# Patient Record
Sex: Female | Born: 1960
Health system: Southern US, Community
[De-identification: ages and names within clinical notes are randomized; demographics above are authoritative.]

## PROBLEM LIST (undated history)

## (undated) DIAGNOSIS — IMO0002 Reserved for concepts with insufficient information to code with codable children: Secondary | ICD-10-CM

## (undated) DIAGNOSIS — A159 Respiratory tuberculosis unspecified: Secondary | ICD-10-CM

## (undated) DIAGNOSIS — I10 Essential (primary) hypertension: Secondary | ICD-10-CM

## (undated) DIAGNOSIS — E119 Type 2 diabetes mellitus without complications: Secondary | ICD-10-CM

## (undated) DIAGNOSIS — M329 Systemic lupus erythematosus, unspecified: Secondary | ICD-10-CM

## (undated) DIAGNOSIS — E785 Hyperlipidemia, unspecified: Secondary | ICD-10-CM

## (undated) HISTORY — PX: NO PAST SURGERIES: SHX2092

---

## 2015-04-10 ENCOUNTER — Institutional Professional Consult (permissible substitution): Payer: Self-pay | Admitting: Internal Medicine

## 2016-01-05 ENCOUNTER — Encounter (HOSPITAL_COMMUNITY): Payer: Self-pay | Admitting: Emergency Medicine

## 2016-01-05 ENCOUNTER — Emergency Department (HOSPITAL_COMMUNITY): Payer: Medicaid - Out of State

## 2016-01-05 ENCOUNTER — Emergency Department (HOSPITAL_COMMUNITY)
Admission: EM | Admit: 2016-01-05 | Discharge: 2016-01-05 | Disposition: A | Discharge status: Home / Self Care | Payer: Medicaid - Out of State | Attending: Emergency Medicine | Admitting: Emergency Medicine

## 2016-01-05 ENCOUNTER — Telehealth: Payer: Self-pay

## 2016-01-05 DIAGNOSIS — E785 Hyperlipidemia, unspecified: Secondary | ICD-10-CM | POA: Insufficient documentation

## 2016-01-05 DIAGNOSIS — R52 Pain, unspecified: Secondary | ICD-10-CM

## 2016-01-05 DIAGNOSIS — Z7984 Long term (current) use of oral hypoglycemic drugs: Secondary | ICD-10-CM | POA: Insufficient documentation

## 2016-01-05 DIAGNOSIS — R1013 Epigastric pain: Secondary | ICD-10-CM | POA: Insufficient documentation

## 2016-01-05 DIAGNOSIS — E119 Type 2 diabetes mellitus without complications: Secondary | ICD-10-CM | POA: Insufficient documentation

## 2016-01-05 HISTORY — DX: Systemic lupus erythematosus, unspecified: M32.9

## 2016-01-05 HISTORY — DX: Reserved for concepts with insufficient information to code with codable children: IMO0002

## 2016-01-05 HISTORY — DX: Hyperlipidemia, unspecified: E78.5

## 2016-01-05 HISTORY — DX: Type 2 diabetes mellitus without complications: E11.9

## 2016-01-05 LAB — CBC
HEMATOCRIT: 35.2 % — AB (ref 36.0–46.0)
HEMOGLOBIN: 11.5 g/dL — AB (ref 12.0–15.0)
MCH: 28.1 pg (ref 26.0–34.0)
MCHC: 32.7 g/dL (ref 30.0–36.0)
MCV: 86.1 fL (ref 78.0–100.0)
Platelets: 223 10*3/uL (ref 150–400)
RBC: 4.09 MIL/uL (ref 3.87–5.11)
RDW: 13 % (ref 11.5–15.5)
WBC: 6.1 10*3/uL (ref 4.0–10.5)

## 2016-01-05 LAB — URINALYSIS, ROUTINE W REFLEX MICROSCOPIC
BILIRUBIN URINE: NEGATIVE
Glucose, UA: NEGATIVE mg/dL
Hgb urine dipstick: NEGATIVE
KETONES UR: NEGATIVE mg/dL
Leukocytes, UA: NEGATIVE
Nitrite: NEGATIVE
PROTEIN: NEGATIVE mg/dL
SPECIFIC GRAVITY, URINE: 1.003 — AB (ref 1.005–1.030)
pH: 7 (ref 5.0–8.0)

## 2016-01-05 LAB — COMPREHENSIVE METABOLIC PANEL
ALBUMIN: 3.8 g/dL (ref 3.5–5.0)
ALK PHOS: 89 U/L (ref 38–126)
ALT: 18 U/L (ref 14–54)
ANION GAP: 9 (ref 5–15)
AST: 23 U/L (ref 15–41)
BUN: 11 mg/dL (ref 6–20)
CO2: 25 mmol/L (ref 22–32)
Calcium: 9.2 mg/dL (ref 8.9–10.3)
Chloride: 102 mmol/L (ref 101–111)
Creatinine, Ser: 0.64 mg/dL (ref 0.44–1.00)
GFR calc Af Amer: >60 mL/min (ref >60)
GFR calc non Af Amer: >60 mL/min (ref >60)
GLUCOSE: 91 mg/dL (ref 65–99)
POTASSIUM: 4.2 mmol/L (ref 3.5–5.1)
SODIUM: 136 mmol/L (ref 135–145)
Total Bilirubin: 0.6 mg/dL (ref 0.3–1.2)
Total Protein: 7.3 g/dL (ref 6.5–8.1)

## 2016-01-05 LAB — LACTIC ACID, PLASMA: LACTIC ACID, VENOUS: 1.2 mmol/L (ref 0.5–2.0)

## 2016-01-05 LAB — LIPASE, BLOOD: Lipase: 24 U/L (ref 11–51)

## 2016-01-05 MED ORDER — OMEPRAZOLE 20 MG PO CPDR: 20.0000 mg | DELAYED_RELEASE_CAPSULE | Freq: Every day | ORAL | Status: DC

## 2016-01-05 MED ORDER — MORPHINE SULFATE (PF) 2 MG/ML IV SOLN
2.0000 mg | Freq: Once | INTRAVENOUS | Status: AC
  Administered 2016-01-05: 2 mg via INTRAVENOUS
  Filled 2016-01-05: qty 1

## 2016-01-05 MED ORDER — PANTOPRAZOLE SODIUM 40 MG IV SOLR
40.0000 mg | Freq: Once | INTRAVENOUS | Status: AC
  Administered 2016-01-05: 40 mg via INTRAVENOUS
  Filled 2016-01-05: qty 40

## 2016-01-05 MED ORDER — HYDROCODONE-ACETAMINOPHEN 5-325 MG PO TABS: 2.0000 | ORAL_TABLET | ORAL | Status: DC | PRN

## 2016-01-05 MED ORDER — MORPHINE SULFATE (PF) 2 MG/ML IV SOLN
1.0000 mg | Freq: Once | INTRAVENOUS | Status: AC
  Administered 2016-01-05: 1 mg via INTRAVENOUS
  Filled 2016-01-05: qty 1

## 2016-01-05 MED ORDER — ONDANSETRON HCL 4 MG PO TABS: 4.0000 mg | ORAL_TABLET | Freq: Four times a day (QID) | ORAL | Status: DC

## 2016-01-05 MED ORDER — SUCRALFATE 1 G PO TABS: 1.0000 g | ORAL_TABLET | Freq: Three times a day (TID) | ORAL | Status: DC

## 2016-01-05 MED ORDER — GI COCKTAIL ~~LOC~~
30.0000 mL | Freq: Once | ORAL | Status: AC
  Administered 2016-01-05: 30 mL via ORAL
  Filled 2016-01-05: qty 30

## 2016-01-05 NOTE — Progress Notes (Signed)
CM spoke with pt who [place]cp.  CM discussed and provided written information to assist pt with determining choice for uninsured accepting pcps, discussed the importance of pcp vs EDP services for f/u care, www.needymeds.org, www.goodrx.com, discounted pharmacies and other [place] resources such as [facility] , [facility], affordable care act, financial assistance, uninsured dental services, [place] med assist, DSS and  health department  Reviewed resources for [place] uninsured accepting pcps like [facility], family medicine at [address], [facility], [facility], local urgent care centers, [facility], [facility], general medical clinics, family services of the [place], [facility] urgent care plus others, medication resources, [facility]ies and housing Pt voiced understanding and appreciation of resources provided   Provided [facility] contact information Pt agreed to a referral Cm completed referral explaining that the pt and sister only speaks vietnamese but Financial controllertracy driver, friend speaks english Pt to be contact by [facility] clinical liason Cm provided pt with a translated list of uninsured resources for transportation, DSS, medication resources and doctors  French Anaracy has a list of english resources for [employer]  Medicaid drs, [place] [employer]insured/medication resources, transportation resources Provided tracy with a 21 page medicaid application

## 2016-01-05 NOTE — ED Notes (Signed)
PA at bedside.

## 2016-01-05 NOTE — Progress Notes (Signed)
Pt with MEDICAID [place] but now with a [place] address Had english speaking visitor but pt indicating visitor "go" referring to gone from room  Cm used female interpreter vietnamese via stratus to speak with pt  Also ED PA, Shanda BumpsJessica F present to evaluate pt pain status Pt informed interpreter she has no family no relatives Pt states she has no transportation Pt states she was brought to ED by a driver but driver stepped out French Anaracy, driver returned near the end of CM assessment and interventions French Anaracy confirms pt is single, lives alone and has only a sister who is singe with children [place] in [place] but had moved to [place] and returned [place] in February 2017 States pt had been informed she could not work, [place] and did not have to pay for medications Pt is not eligible per French Anaracy for disability Cm explained to pt with interpreter and to Join[place]acy that each state in the [place] has a different medicaid program and in [place] pt medication may be $3 or less if she qualifies for [facility] Tracy offers to assist pt with medicaid application French Anaracy states she is not family or friend of pt, she just know her and pt came to her to ask to get her to hospital today.  Confirms pt without cell phone, Internet or home phone CM encouraged French Anaracy to assist pt to become involved in a [place] church environment that can get the pt connected with support system. Discussed that medicaid transportation is available to medical appts when pt gets medicaid Explained that without Medicaid in [place] the pt is considered uninsured at this time  Offered tracy a list of [place] [employer] doctors, DSS contact number and address, a list of [place] uninsured resources

## 2016-01-05 NOTE — ED Notes (Signed)
Per pt, states left lower abdominal pain for 4 days-no vomiting just nausea

## 2016-01-05 NOTE — ED Notes (Signed)
Patient tolerating crackers and peanut butter. Social work at bedside to provide resources for follow up, transportation assistance, and medication assistance.

## 2016-01-05 NOTE — ED Provider Notes (Signed)
CSN: [id number]     Arrival date & time 01/05/16  1102 History   First MD Initiated Contact with Patient 01/05/16 1124     Chief Complaint  Patient presents with  . Abdominal Pain     (Consider location/radiation/quality/duration/timing/severity/associated sxs/prior Treatment) Patient is a 55 y.o. female presenting with abdominal pain. The history is provided by the patient and a friend. A language interpreter was used.  Abdominal Pain Associated symptoms: cough, fever and nausea   Associated symptoms: no chest pain, no chills, no constipation, no diarrhea, no dysuria, no hematuria, no shortness of breath, no sore throat and no vomiting    Patient is a 55 year old female with a history of diabetes, GERD, Lupus who presents to the ED with 4 days of worsening abdominal pain. She states the pain begins in her abdomen and radiates superiorly to the epigastric region. Patient states pain is worse with eating, worse with lying down, worse with deep inspiration, worse with burping. Patient states she's not eaten in 4 days. She said she can tolerate water. Patient also has right and left-sided rib pain that she describes as deep and worse if she lies on her sides. Patient states associated arthralgias, myalgias, nausea, subjective fever, joint swelling, increased redness, warmth, and swelling in her hands that she states is from her Lupus. Patient denies chest pain, short of breath, visual changes, dysuria, hematuria, hematochezia, changes in bowel or bladder functions. Patient was on chronic prednisone but ran out roughly a month and half ago and has not taken it since. Patient also endorses a mild intermittent dry cough. [place] and [place] trying to establish care. She was going to see Dr. Shelva MajesticWest, pulmonology today when they instructed her to come to the ED to have her abdominal pain evaluated.   Past Medical History  Diagnosis Date  . Diabetes mellitus without complication (HCC)    . GERD (gastroesophageal reflux disease)   . Hyperlipidemia   . Lupus (HCC)    No past surgical history on file. No family history on file. Social History  Substance Use Topics  . Smoking status: Never Smoker   . Smokeless tobacco: None  . Alcohol Use: No   OB History    No data available     Review of Systems  Constitutional: Positive for fever. Negative for chills and diaphoresis.  HENT: Negative for ear pain, facial swelling, sore throat and trouble swallowing.   Eyes: Negative for visual disturbance.  Respiratory: Positive for cough. Negative for chest tightness and shortness of breath.   Cardiovascular: Negative for chest pain and leg swelling.  Gastrointestinal: Positive for nausea and abdominal pain. Negative for vomiting, diarrhea, constipation, blood in stool and abdominal distention.  Genitourinary: Negative for dysuria and hematuria.  Musculoskeletal: Positive for myalgias, joint swelling and arthralgias. Negative for back pain and neck pain.  Skin: Positive for color change. Negative for rash.  Neurological: Negative for dizziness, syncope, weakness and numbness.      Allergies  Review of patient's allergies indicates no known allergies.  Home Medications   Prior to Admission medications   Medication Sig Start Date End Date Taking? Authorizing Provider  ASA-APAP-Salicyl-Caff (PAIN RELIEF) TABS Take 1 tablet by mouth daily as needed (pain).   Yes Historical Provider, MD  atorvastatin (LIPITOR) 20 MG tablet Take 20 mg by mouth daily.   Yes Historical Provider, MD  metFORMIN (GLUCOPHAGE) 500 MG tablet Take 500 mg by mouth daily.   Yes Historical Provider, MD  predniSONE (DELTASONE) 10 MG tablet Take 10 mg by mouth 3 (three) times daily.   Yes Historical Provider, MD  ranitidine (ZANTAC) 150 MG tablet Take 150 mg by mouth 2 (two) times daily.   Yes Historical Provider, MD  HYDROcodone-acetaminophen (NORCO/VICODIN) 5-325 MG tablet Take 2 tablets by mouth every 4  (four) hours as needed. 01/05/16   Jerre Simon, PA  omeprazole (PRILOSEC) 20 MG capsule Take 1 capsule (20 mg total) by mouth daily. 01/05/16   Maylene Crocker L Treniyah Lynn, PA  ondansetron (ZOFRAN) 4 MG tablet Take 1 tablet (4 mg total) by mouth every 6 (six) hours. 01/05/16   Jerre Simon, PA  sucralfate (CARAFATE) 1 g tablet Take 1 tablet (1 g total) by mouth 4 (four) times daily -  with meals and at bedtime. 01/05/16   Emry Barbato L Ranald Alessio, PA   BP 166/98 mmHg  Pulse 100  Temp(Src) 98 F (36.7 C) (Oral)  Resp 16  Wt 43.092 kg  SpO2 99%  LMP  Physical Exam  Constitutional: She appears well-developed and well-nourished. No distress.  HENT:  Head: Normocephalic and atraumatic.  Eyes: Conjunctivae are normal. No scleral icterus.  Neck: Normal range of motion. Neck supple.  Cardiovascular: Normal rate, regular rhythm and normal heart sounds.  Exam reveals no gallop and no friction rub.   No murmur heard. Pulses:      Radial pulses are 2+ on the right side, and 2+ on the left side.       Dorsalis pedis pulses are 2+ on the right side, and 2+ on the left side.  Pulmonary/Chest: Effort normal and breath sounds normal. No respiratory distress. She has no wheezes. She has no rales. She exhibits no tenderness.  Rib pain not reproducible on exam  Abdominal: Normal appearance and bowel sounds are normal. She exhibits no distension. There is no rigidity, no rebound, no guarding, no CVA tenderness and no tenderness at McBurney's point.  Generalized abdominal tenderness, exquisitely tender to palpation of the epigastric region,  Musculoskeletal:  No edema, no deformities noted. Examination of bilateral hands revealed erythematous, edematous MCP PIP and DIP joints, skin is erythematous.  Neurological: She is alert. Coordination normal.  Skin: Skin is warm and dry. She is not diaphoretic.  Psychiatric: She has a normal mood and affect. Her behavior is normal.    ED Course  Procedures (including critical care  time) Labs Review Labs Reviewed  CBC - Abnormal; Notable for the following:    Hemoglobin 11.5 (*)    HCT 35.2 (*)    All other components within normal limits  URINALYSIS, ROUTINE W REFLEX MICROSCOPIC (NOT AT [facility]) - Abnormal; Notable for the following:    Specific Gravity, Urine 1.003 (*)    All other components within normal limits  LIPASE, BLOOD  COMPREHENSIVE METABOLIC PANEL  LACTIC ACID, PLASMA  LACTIC ACID, PLASMA    Imaging Review Dg Abd Acute W/chest  01/05/2016  CLINICAL DATA:  Severe pain in the diaphragm radiating into the chest. Shortness of breath. EXAM: DG ABDOMEN ACUTE W/ 1V CHEST COMPARISON:  None. FINDINGS: Old fractures involving the left fourth and fifth ribs. No significant airspace disease or pulmonary edema in the lungs. Heart and mediastinum are within normal limits. Atherosclerotic calcifications at the aortic arch. Nonobstructive bowel gas pattern. Gas and stool in the colon. No evidence for free air. No large abdominal calcifications. Bone structures appear to be intact. IMPRESSION: No acute abnormalities. Electronically Signed   By: Meriel Pica.D.  On: 01/05/2016 13:04   I have personally reviewed and evaluated these images and lab results as part of my medical decision-making.   EKG Interpretation   Date/Time:  Tuesday Jan 05 2016 13:38:52 EDT Ventricular Rate:  87 PR Interval:  140 QRS Duration: 88 QT Interval:  357 QTC Calculation: 429 R Axis:   90 Text Interpretation:  Sinus rhythm Borderline right axis deviation  Borderline T abnormalities, anterior leads Baseline wander in lead(s) V1  Confirmed by Fayrene Fearing  MD, MARK ([id number]) on 01/05/2016 2:06:36 PM      MDM   Final diagnoses:  Pain  Epigastric pain    Patient is nontoxic, nonseptic appearing, in no apparent distress.  Patient's pain and other symptoms adequately managed in emergency department.  Fluid bolus given.  Labs, imaging, EKG and vitals reviewed and are all reassuring.  Patient does  not meet the SIRS or Sepsis criteria.  On repeat exam patient does not have a surgical abdomin and there are no peritoneal signs.  No indication of appendicitis, bowel obstruction, bowel perforation, cholecystitis, diverticulitis. Pt was eating and drinking in the ED prior to discharge without complaints and without nausea or vomiting. This is likely a gastritis. Pts pain improved with a GI cocktail and pain meds.  Patient discharged home with symptomatic treatment and given strict instructions for follow-up with a primary care physician and a GI specialist regarding her abdominal pain and her lupus.  I have also discussed reasons to return immediately to the ER.  Patient expresses understanding and agrees with plan.  Case discussed with Dr. Fayrene Fearing who agrees with the above plan.        Jerre Simon, PA 01/06/16 [id number]  Rolland Porter, MD 01/14/16 [id number]

## 2016-01-05 NOTE — Discharge Instructions (Signed)
Follow up with Dr. Shelva MajesticWest in 2 days regarding your Lupus and abdominal pain.   Obtain a primary care provider.   Return to the emergency department if you experience worsening abdominal pain, vomiting, diarrhea, fever, chills, blood in your vomit or stools.  ?au b?ng, Ng??i l?n (Abdominal Pain, Adult) C nhi?u nguyn nhn d?n ??n ?au b?ng. Thng th??ng ?au b?ng l do m?t b?nh gy ra v s? khng ?? n?u khng ?i?u tr?Marland Kitchen. B?nh ny c th? ???c theo di v ?i?u tr? t?i nh. Chuyn gia ch?m [place] s?c kh?e s? ti?n hnh khm th?c th? v c th? yu c?u lm xt nghi?m mu v ch?p X quang ?? xc ??nh m?c ?? nghim tr?ng c?a c?n ?au b?ng. Tuy nhin, trong nhi?u tr??ng h?p, ph?i m?t nhi?u th?i gian h?n ?? xc ??nh r nguyn nhn gy ?au b?ng. Tr??c khi tm ra nguyn nhn, chuyn gia ch?m [place] s?c kh?e c th? khng bi?t li?u qu v? c c?n lm thm xt nghi?m ho?c ti?p t?c ?i?u tr? hay khng. H??NG D?N CH?M [place] T?I NH  Theo di c?n ?au b?ng xem c b?t k? thay ??i no khng. Nh?ng hnh ??ng sau c th? gip lo?i b? b?t c? c?m gic kh ch?u no qu v? ?ang b?.  Ch? s? d?ng thu?c khng c?n k ??n ho?c thu?c c?n k ??n theo ch? d?n c?a chuyn gia ch?m [place] s?c kh?e.  Khng dng thu?c nhu?n trng tr? khi ???c chuyn gia ch?m [place] s?c kh?e ch? ??nh.  Th? dng m?t ch? ?? ?n l?ng (n??c lu?c th?t, tr, ho?c n??c) theo ch? d?n c?a chuyn gia ch?m [place] s?c kh?e. Chuy?n d?n sang m?t ch? ?? ?n nh? n?u ch?u ???c. ?I KHM N?U:  Qu v? b? ?au b?ng khng r nguyn nhn.  Qu v? b? ?au b?ng km theo bu?n nn ho?c tiu ch?y.  Qu v? b? ?au khi ?i ti?u ho?c ?i ngoi.  Qu v? b? c?n ?au b?ng lm th?c gi?c vo ban ?m.  Qu v? b? ?au b?ng n?ng thm ho?c ?? h?n khi ?n.  Qu v? b? ?au b?ng n?ng thm khi ?n ?? ?n nhi?u ch?t bo.  Qu v? b? s?t. NGAY L?P T?C ?I KHM N?U:   C?n ?au khng kh?i trong vng 2 gi?Ladell Heads.  Qu v? v?n ti?p t?c b? i (nn m?a).  Ch? c th? c?m th?y ?au ? m?t s? ph?n b?ng, ch?ng h?n nh? ? ph?n b?ng bn ph?i  ho?c bn d??i tri.  Phn c?a qu v? c mu ho?c c mu ?en nh? h?c n. ??M B?O QU V?:  Hi?u r cc h??ng d?n ny.  S? theo di tnh tr?ng c?a mnh.  S? yu c?u tr? gip ngay l?p t?c n?u qu v? c?m th?y khng kh?e ho?c th?y tr?m tr?ng h?n.   Thng tin ny khng nh?m m?c ?ch thay th? cho l?i khuyn m chuyn gia ch?m [place] s?c kh?e ni v?i qu v?. Hy b?o ??m qu v? ph?i th?o lu?n b?t k? v?n ?? g m qu v? c v?i chuyn gia ch?m [place] s?c kh?e c?a qu v?.   Document Released: 07/25/2005 Document Revised: 08/15/2014 Elsevier Interactive Patient Education [employer]2016 Elsevier Inc.

## 2016-01-05 NOTE — Telephone Encounter (Signed)
Pt is in the lobby (with Translator) requesting a new patient consult. [place], recently moved down to [place], has all records in her possession.  Having severe pain in diaphragm radiating into chest. C/o SOB, nausea x 4 days and coughing up white-yellow mucus x 1 week. States that pain is worse with breathing and eating. Pt has been unable to eat for 24hrs d/t pain being 10/10, pt able to drink water with pain level at about 5/10. Pt unable to sleep d/t not being able to lay flat. Does not have a PCP and was advised to go to ED by Translator last night - pt refused- did not understand recommendations.  Spoke with Dr Kendrick FriesMcQuaid, advised pt to go to ED to be checked out as her symptoms sound more than just Pulmonary.  Advised patient, through translator, that [facility]. Expressed understanding. Nothing further needed.

## 2016-01-08 ENCOUNTER — Telehealth: Payer: Self-pay | Admitting: Internal Medicine

## 2016-01-08 NOTE — Telephone Encounter (Signed)
Pt scheduled to see Amy Esterwood PA 01/13/16@10 :30am for abdominal pain and nausea. Please notify pt of appt.

## 2016-01-08 NOTE — Telephone Encounter (Signed)
Doc of Day  Caller name: Lucille Passyrang Call back number: [phone]   Reason for call:  Pt was seen in ED and advised to follow up in 2 days per AVS.  [place]

## 2016-01-12 ENCOUNTER — Ambulatory Visit (INDEPENDENT_AMBULATORY_CARE_PROVIDER_SITE_OTHER): Payer: Medicaid - Out of State | Admitting: Pulmonary Disease

## 2016-01-12 ENCOUNTER — Encounter: Payer: Self-pay | Admitting: Pulmonary Disease

## 2016-01-12 VITALS — BP 128/78 | HR 119 | Ht 59.0 in | Wt 90.6 lb

## 2016-01-12 DIAGNOSIS — M329 Systemic lupus erythematosus, unspecified: Secondary | ICD-10-CM

## 2016-01-12 DIAGNOSIS — J849 Interstitial pulmonary disease, unspecified: Secondary | ICD-10-CM

## 2016-01-12 DIAGNOSIS — A150 Tuberculosis of lung: Secondary | ICD-10-CM

## 2016-01-12 DIAGNOSIS — R1011 Right upper quadrant pain: Secondary | ICD-10-CM

## 2016-01-12 NOTE — Patient Instructions (Addendum)
1.  We will order a chest ct scan to check your lungs.  2. We will hold off on your lupus meds pending chest ct scan.  3. Call the office if you start having cough, fever, shortness of breath.   Return to clinic in 3 months.    1. Chng ti s? yu c?u ch?p CTTG ?? ki?m tra ph?i c?a b?n. 2. Chng ti s? gi? trn lupus med c?a b?n ?ang ch? qut ng?c. 3. G?i ?i?n tho?i cho v?n phng n?u b?n b?t ??u b? ho, s?t, th? d?c.  Quay tr? l?i phng khm trong vng 3 thng.

## 2016-01-12 NOTE — Progress Notes (Signed)
Subjective:    Patient ID: Cindy Wheeler, female    DOB: May 18, 1961, 55 y.o.   MRN: [id number]  HPI   This is the case of Cindy Wheeler, 55 y.o. Female, who was referred by Dr. Tish Men  in consultation regarding dyspnea and her lung issues.   As you very well know, patient is [place] and I was able to communicate with her with an interpreter.  Pt was recently seen at ED for abd pain. Given her lung issues, she was sent to our office to be seen.   Pt has been in the [place] since 2003. She denies any lung issues until 2016. No history of asthma, copd, PTB until 2016. She has been in [place] since 2003. She visited [place] in 2015 and she stayed there for 3 mos. While she was visiting, she end up being sick -- had fever, chills, weight loss, cough, dyspnea, joint pains.  She came back to the [place] in 09/2014.  Her first stop was [place], [place].  I think she was very symptomatic so she ended up seeing doctors and was admitted at [facility].  Pt was seen by Pulm and ID.  She had acute hypoxemic respiratory failure and diffuse pulmonary infiltrates.  She end up having bronchoscopy.    Pt was treated as lupus pneumonitis while in [place].  She ended up being on high steroid and prednisone doses as well as CellCept and Bactrim for PCP prophylaxis.  She was being seen by Dr. Melanie Crazier.  The last office note by Dr. Michel Bickers (March 05, 2015) stated that pt had clinically and radiographically  improved with prednisone (she was on 30 mg/d) as well as CellCept  (500 mg in am and 1000 mg in pm). The plan was to wean off prednisone. She would need to see pulmonary as she had planned to move back to [place].   Pt also ended up being (+) for PTB with the BAL from bronchoscopy. She ended up being on 4 medications for PTB and she was actively managed by [facility] in [place].  Per pt, she was given 4 meds (Ethambutol, Isoniazid, Rifampin) for 6 months.  She had stopped the TB meds even before  leaving for [facility].  Pt ended up coming back to [facility] in 09/2015.  She was NOT able to see any MD until her ER visit end of May 2017.  She ran out of prednisone in 11/2015.  She also was not able to get any CellCept since 09/2015. She has remained asymptomatic until end of May 2017.  She started having episodic substernal discomfort.  That resolved after < 1 week.  She then had RUQ episodic pain which she describes bothers her with inspiration and she denied any association with food.  She presented to ED with this and no cause was found for her RUQ pain. Other than her pain and her persistent poor appetite (but denies significant weight loss), she remains relatively OK. She denies being "sick" again just like how she was in 09/2014 in [place].    Review of Systems  Constitutional: Negative.  Negative for fever, chills and unexpected weight change.  HENT: Negative.  Negative for congestion, dental problem, ear pain, nosebleeds, postnasal drip, rhinorrhea, sinus pressure, sneezing, sore throat and trouble swallowing.   Eyes: Negative.  Negative for redness and itching.  Respiratory: Positive for cough and shortness of breath. Negative for chest tightness and wheezing.   Cardiovascular: Negative for palpitations and leg swelling.  Gastrointestinal: Positive for abdominal pain. Negative for nausea and vomiting.       RUQ abd pain x 1-2 weeks, worse with inspiration.   Genitourinary: Negative for dysuria.  Musculoskeletal: Positive for joint swelling and arthralgias.  Skin: Positive for rash.  Allergic/Immunologic: Negative.   Neurological: Positive for dizziness and light-headedness. Negative for headaches.  Hematological: Negative.  Does not bruise/bleed easily.  Psychiatric/Behavioral: Negative.  Negative for dysphoric mood. The patient is not nervous/anxious.    Past Medical History  Diagnosis Date  . Diabetes mellitus without complication (HCC)   . GERD (gastroesophageal reflux disease)    . Hyperlipidemia   . Lupus (HCC)    (-) CA, DVT Denies any PTB exposure or dse until 09/2014  No family history on file.  Mother is deceased. Had DM. Father is healthy.  (-) PTB in family.   No past surgical history on file.  (-) surgery.   Social History   Social History  . Marital Status: Single    Spouse Name: N/A  . Number of Children: N/A  . Years of Education: N/A   Occupational History  . Not on file.   Social History Main Topics  . Smoking status: Never Smoker   . Smokeless tobacco: Not on file  . Alcohol Use: No  . Drug Use: Not on file  . Sexual Activity: Not on file   Other Topics Concern  . Not on file   Social History Narrative  From [place], been in [place] since 2003. 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 (-) smoke, ETOH.  Single, no children. Lives alone. Has sisters/family in [place].   No Known Allergies   Outpatient Prescriptions Prior to Visit  Medication Sig Dispense Refill  . ASA-APAP-Salicyl-Caff (PAIN RELIEF) TABS Take 1 tablet by mouth daily as needed (pain).    Marland Kitchen atorvastatin (LIPITOR) 20 MG tablet Take 20 mg by mouth daily.    Marland Kitchen HYDROcodone-acetaminophen (NORCO/VICODIN) 5-325 MG tablet Take 2 tablets by mouth every 4 (four) hours as needed. 16 tablet 0  . metFORMIN (GLUCOPHAGE) 500 MG tablet Take 500 mg by mouth daily.    Marland Kitchen omeprazole (PRILOSEC) 20 MG capsule Take 1 capsule (20 mg total) by mouth daily. 30 capsule 0  . ondansetron (ZOFRAN) 4 MG tablet Take 1 tablet (4 mg total) by mouth every 6 (six) hours. 20 tablet 0  . predniSONE (DELTASONE) 10 MG tablet Take 10 mg by mouth 3 (three) times daily.    . ranitidine (ZANTAC) 150 MG tablet Take 150 mg by mouth 2 (two) times daily.    . sucralfate (CARAFATE) 1 g tablet Take 1 tablet (1 g total) by mouth 4 (four) times daily -  with meals and at bedtime. 20 tablet 0   No facility-administered medications prior to visit.   No orders of the defined types were placed in this encounter.           Objective:   Physical Exam  Vitals:  Filed Vitals:   01/12/16 1512  BP: 128/78  Pulse: 119  Height: 4\' 11"  (1.499 m)  Weight: 90 lb 9.6 oz (41.096 kg)  SpO2: 99%    Constitutional/General:  Pleasant, well-nourished, well-developed, not in any distress,  Comfortably seating.  Well kempt  Body mass index is 18.29 kg/(m^2). Wt Readings from Last 3 Encounters:  01/12/16 90 lb 9.6 oz (41.096 kg)  01/05/16 95 lb (43.092 kg)      HEENT: Pupils equal and reactive to light and accommodation. Anicteric sclerae. Normal  nasal mucosa.   No oral  lesions,  mouth clear,  oropharynx clear, no postnasal drip. (-) Oral thrush. No dental caries.  Airway - Mallampati class III  Neck: No masses. Midline trachea. No JVD, (-) LAD. (-) bruits appreciated.  Respiratory/Chest: Grossly normal chest. (-) deformity. (-) Accessory muscle use.  Symmetric expansion. (-) Tenderness on palpation.  Resonant on percussion.  Diminished BS on both lower lung zones. (-) wheezing, crackles, rhonchi (-) egophony  Cardiovascular: Regular rate and  rhythm, heart sounds normal, no murmur or gallops, no peripheral edema  Gastrointestinal:  Normal bowel sounds. Soft. No hepatosplenomegaly. Some RUQ tenderness. (-) rebound.  (-) masses.   Musculoskeletal:  Normal muscle tone. Normal gait.   Extremities: Grossly normal. (-) clubbing, cyanosis.  (-) edema  Skin: (-) rash,lesions seen.   Neurological/Psychiatric : alert, oriented to time, place, person. Normal mood and affect            Assessment & Plan:  ILD (interstitial lung disease) (HCC) Pt had diffuse pulmonary infiltrates and hypoxemia in 09/2014. She was symptomatic with fevers, chills, cough, weight loss and SOB. Treated as lupus pneumonitis with high dose prednisone and Cell Cept (500 mg in am and 100 mg in pm).  She was in [place] during this time and moved back to [place] in 09/2015. She ran out of Cell Cept in 09/2015 and  Prednisone (30 mg/d) in 11/2015. She remains asymptomatic except for a 2 week h/o RUQ pain in 12/2015.  CXR in 12/2015 > No definite infiltrate seen.  CXR in 02/2015 > mild bibasilar infiltrates (per records)  I am not sure if the "RUQ pain" is a manifestation of her lupus pneumonitis flaring up as pt weaned off meds. No other sx.   Plan to get a HRCT scan.  If she has infiltrates, likely will need a bronch and BAL as we need to R/O PTB again and other OI.  Will hold off on meds pending HRCT.   TB (pulmonary tuberculosis) Pt had diffuse lung infiltrates in 09/2014. Had bronch and BAL which showed PTB.  Was being manged by [facility] [place]. Pt was on 4 meds (Isoniazid, Rifampin, Ethambutol) for 6 mos. Finished sometime end of 2016.  Not sure if pt was treated as latent TB vs active TB.  I think she was treated as a latent TB.  Need to rpt chest ct scan. If (+) for significant infiltrates, will need bronch and BAL to R/O PTB. Need to get records from TB clinic.   Lupus (HCC) Pt treated as severe lupus pneumonitis in 09/2014. Was on prednisone and cellcept. These meds were weaned off by the pt by 11/2015.   RUQ pain Pt with episodic RUQ pain x 1-2 weeks. Associated with inspiration. Denies any association with food. Pt is to see her PCP on 6/7.  I will defer w/u to PCP.  Likely will need RUQ US > not sure if she has gallstones.  Likely will need GI evaluation.      I personally reviewed previous images (Chest Xray, Chest Ct scan) done on this patient. I reviewed the reports on the images as well. I also extensively reviewed patient records [place], [place].    Thank you very much for letting me participate in this patient's care. Please do not hesitate to give me a call if you have any questions or concerns regarding the treatment plan.   Patient will follow up with me in 2-3 months, or sooner if HRCT  is (+).  I advised pt to call the office if she gets more symptomatic.      Pollie Meyer, MD 01/13/2016   3:44 AM Pulmonary and Critical Care Medicine [facility] Pager: [phone] Office: [phone], Fax: [phone]

## 2016-01-13 ENCOUNTER — Encounter: Payer: Self-pay | Admitting: Physician Assistant

## 2016-01-13 ENCOUNTER — Telehealth: Payer: Self-pay | Admitting: Pulmonary Disease

## 2016-01-13 ENCOUNTER — Ambulatory Visit (INDEPENDENT_AMBULATORY_CARE_PROVIDER_SITE_OTHER): Payer: Self-pay | Admitting: Physician Assistant

## 2016-01-13 VITALS — BP 136/70 | HR 104 | Ht <= 58 in | Wt 90.1 lb

## 2016-01-13 DIAGNOSIS — M329 Systemic lupus erythematosus, unspecified: Secondary | ICD-10-CM | POA: Insufficient documentation

## 2016-01-13 DIAGNOSIS — IMO0002 Reserved for concepts with insufficient information to code with codable children: Secondary | ICD-10-CM | POA: Insufficient documentation

## 2016-01-13 DIAGNOSIS — R1011 Right upper quadrant pain: Secondary | ICD-10-CM | POA: Insufficient documentation

## 2016-01-13 DIAGNOSIS — J849 Interstitial pulmonary disease, unspecified: Secondary | ICD-10-CM | POA: Insufficient documentation

## 2016-01-13 DIAGNOSIS — A150 Tuberculosis of lung: Secondary | ICD-10-CM | POA: Insufficient documentation

## 2016-01-13 NOTE — Assessment & Plan Note (Signed)
Pt with episodic RUQ pain x 1-2 weeks. Associated with inspiration. Denies any association with food. Pt is to see her PCP on 6/7.  I will defer w/u to PCP.  Likely will need RUQ US > not sure if she has gallstones.  Likely will need GI evaluation.

## 2016-01-13 NOTE — Patient Instructions (Signed)
Proceed with the CT of the chest at [facility] on 01-20-2016 at 9:30 am.  The doctor will get back to you with the CT results.  There is no appointment available on that date for an Ultrasound.

## 2016-01-13 NOTE — Progress Notes (Addendum)
Patient ID: [place], female   DOB: 07/08/1961, 55 y.o.   MRN: [id number]   Subjective:    Patient ID: [place], female    DOB: [birth year], 55 y.o.   MRN: [id number]  HPI C[place] is a 55 year old [place] female, new to GI today referred by [facility]  pulmonology /Dr DeDios for evaluation of right upper quadrant abdominal pain. Patient is non-English speaking and just located to [place] from [place] Some of her history is gleaned from the pulmonary consultation done yesterday. She apparently had been in [place] since 2003 but went back to [place] in 2015 to visit and then became ill while she was there with fever or chills weight loss cough and joint pains. She returned to [place] in February 2016 and went to [place]. She became sick while there was admitted to the hospital with respiratory failure and pulmonary infiltrates. She was treated as lupus pneumonitis and was treated with steroids and CellCept as well as Bactrim for PCP prophylaxis. She had bronchoscopy which was positive for PE TB and was treated for 6 months. Am not exactly sure of the time line but says that she just returned to [facility] in February 2017. She has a sister who lives here she was not able to see an M.D. until she went to the emergency room in May. She had run out of her medications in April then started developing substernal discomfort right upper quadrant pain with inspiration and cough. Plan per pulmonary was to get a CT of the chest I, then decide  if she needed bronchoscopy .She was not given any new medications. Patient states she's not had any prior GI problems. She says her current right upper quadrant pain has been present for about 2 weeks and constant and is worse with inspiration or lying on her side. She has not noticed any change with by mouth intake appetite has been fair weight has been stable she denies any nausea or vomiting, no dysphagia, or odynophagia. No known injury muscle strain etc. Bowel  movements have been normal no melena or hematochezia. He denies any recent fever or chills. She does have a prescription for hydrocodone but wasn't sure if it was okay to use this for her right upper quadrant pain. Labs done on 01/05/2016 CBC and CMET unremarkable with the exception of hemoglobin 11.5 hematocrit 35.2 and MCV of 86.  Patient tells me today that she does not have any means of transportation to get to any testing and that her sister who drives only knows how to get from her place of employment back and forth to home. Patient has no insurance, no means of support and no money.  Review of Systems Pertinent positive and negative review of systems were noted in the above HPI section.  All other review of systems was otherwise negative.  Outpatient Encounter Prescriptions as of 01/13/2016  Medication Sig  . HYDROcodone-acetaminophen (NORCO/VICODIN) 5-325 MG tablet Take 2 tablets by mouth every 4 (four) hours as needed.  Marland Kitchen omeprazole (PRILOSEC) 20 MG capsule Take 1 capsule (20 mg total) by mouth daily.  . ranitidine (ZANTAC) 150 MG tablet Take 150 mg by mouth 2 (two) times daily.  . [DISCONTINUED] ASA-APAP-Salicyl-Caff (PAIN RELIEF) TABS Take 1 tablet by mouth daily as needed (pain).  . [DISCONTINUED] atorvastatin (LIPITOR) 20 MG tablet Take 20 mg by mouth daily.  . [DISCONTINUED] metFORMIN (GLUCOPHAGE) 500 MG tablet Take 500 mg by mouth daily.  . [DISCONTINUED] ondansetron (ZOFRAN) 4 MG tablet Take 1  tablet (4 mg total) by mouth every 6 (six) hours.  . [DISCONTINUED] predniSONE (DELTASONE) 10 MG tablet Take 10 mg by mouth 3 (three) times daily.  . [DISCONTINUED] sucralfate (CARAFATE) 1 g tablet Take 1 tablet (1 g total) by mouth 4 (four) times daily -  with meals and at bedtime.   No facility-administered encounter medications on file as of 01/13/2016.   No Known Allergies Patient Active Problem List   Diagnosis Date Noted  . ILD (interstitial lung disease) (HCC) 01/13/2016  . TB  (pulmonary tuberculosis) 01/13/2016  . Lupus (HCC) 01/13/2016  . RUQ pain 01/13/2016   Social History   Social History  . Marital Status: Single    Spouse Name: N/A  . Number of Children: 0  . Years of Education: N/A   Occupational History  . Not on file.   Social History Main Topics  . Smoking status: Never Smoker   . Smokeless tobacco: Never Used  . Alcohol Use: No  . Drug Use: No  . Sexual Activity: Not on file   Other Topics Concern  . Not on file   Social History Narrative    Ms. Scherger's family history includes Diabetes in her mother.      Objective:    Filed Vitals:   01/13/16 1021  BP: 136/70  Pulse: 104    Physical Exam  well-developed [place] female in no acute distress, she is accompanied by an interpreter and does not speak any [place]. Blood pressure 136/70, pulse 104 height 4 foot 9 weight 90 pounds BMI of 19 HEENT ;nontraumatic normocephalic EOMI PERRLA sclera anicteric, Cardiovascular regular rate and rhythm with S1-S2 slightly tachy, no murmur rub or gallop, Pulmonary ;clear bilaterally, Abdomen ;soft, there is no palpable mass or hepatosplenomegaly she is tender in the right upper quadrant to deep palpation, there is no specific tenderness along the costal margin, she does have a small macular erythematous lesion along her right flank which she says is pruritic, bowel sounds present, Rectal; exam not done, Extremities no clubbing cyanosis or edema skin warm and dry, Neuropsych ;mood and affect appropriate     Assessment & Plan:   #1 55 yo Non-English speaking [place] female recently located to [place] with complaints of right upper quadrant pain 2 weeks which by history sounds pleuritic as it is significantly increased with inspiration. Etiology of her pain is not clear. Patient has history of lupus pneumonitis by her records and was also treated for TB in 2016 while she was living in [place]. She had pulmonary evaluation yesterday and CT of  the chest is pending. It is felt she may require bronchoscopy as well. #2 mild normocytic anemia #3 complete lack of resources  Plan; we attempted to get an upper abdominal ultrasound scheduled on the same day that her CT of the chest is scheduled for next week on [facility]/22/2015 [facility]. They did not have any openings that day. Had an extensive conversation with the patient who is primarily concerned about her lack of ability to have transportation to any of her testing. She also says she doesn't know where to go to get help and has no insurance or money. I will contact social services at [facility] for advice regarding any sources that may be available to this patient and we will contact her with that information. I'm not sure that she'll go for the CT next week as she says she has no way to get there. I believe she needs  pulmonary workup first, I am not sure how much GI workup we are going to be able to accomplish given her situation. She was advised she could use the hydrocodone she has perhaps one half tablet every 4-6 hours as needed if her right upper quadrant pain is severe and also advised use of a heating pad   Patriece Archbold S Yousof Alderman PA-C 01/13/2016   Cc: No ref. provider found  Addendum: Reviewed and agree with initial management. Beverley Fiedler, MD

## 2016-01-13 NOTE — Assessment & Plan Note (Signed)
Pt treated as severe lupus pneumonitis in 09/2014. Was on prednisone and cellcept. These meds were weaned off by the pt by 11/2015.

## 2016-01-13 NOTE — Assessment & Plan Note (Signed)
Pt had diffuse lung infiltrates in 09/2014. Had bronch and BAL which showed PTB.  Was being manged by [facility] [place]. Pt was on 4 meds (Isoniazid, Rifampin, Ethambutol) for 6 mos. Finished sometime end of 2016.  Not sure if pt was treated as latent TB vs active TB.  I think she was treated as a latent TB.  Need to rpt chest ct scan. If (+) for significant infiltrates, will need bronch and BAL to R/O PTB. Need to get records from TB clinic.

## 2016-01-13 NOTE — Telephone Encounter (Signed)
[place] -- 1. Can you get records (last 2-3 OV notes) from [facility] in [place]? 2. Can we get the images of CXR and/ or Chest Ct scan on a disc from [facility] in [place]?   Thanks!  AD

## 2016-01-13 NOTE — Assessment & Plan Note (Addendum)
Pt had diffuse pulmonary infiltrates and hypoxemia in 09/2014. She was symptomatic with fevers, chills, cough, weight loss and SOB. Treated as lupus pneumonitis with high dose prednisone and Cell Cept (500 mg in am and 100 mg in pm).  She was in [place] during this time and moved back to [place] in 09/2015. She ran out of Cell Cept in 09/2015 and Prednisone (30 mg/d) in 11/2015. She remains asymptomatic except for a 2 week h/o RUQ pain in 12/2015.  CXR in 12/2015 > No definite infiltrate seen.  CXR in 02/2015 > mild bibasilar infiltrates (per records)  I am not sure if the "RUQ pain" is a manifestation of her lupus pneumonitis flaring up as pt weaned off meds. No other sx.   Plan to get a HRCT scan.  If she has infiltrates, likely will need a bronch and BAL as we need to R/O PTB again and other OI.  Will hold off on meds pending HRCT.

## 2016-01-20 ENCOUNTER — Ambulatory Visit (INDEPENDENT_AMBULATORY_CARE_PROVIDER_SITE_OTHER)
Admission: RE | Admit: 2016-01-20 | Discharge: 2016-01-20 | Disposition: A | Discharge status: Home / Self Care | Payer: Self-pay | Source: Ambulatory Visit | Attending: Pulmonary Disease | Admitting: Pulmonary Disease

## 2016-01-20 DIAGNOSIS — J849 Interstitial pulmonary disease, unspecified: Secondary | ICD-10-CM

## 2016-01-20 NOTE — Telephone Encounter (Signed)
Records requests faxed to both facilities. Confirmation received. Nothing further needed.

## 2016-01-21 ENCOUNTER — Ambulatory Visit: Payer: Self-pay | Admitting: Pulmonary Disease

## 2016-01-21 ENCOUNTER — Ambulatory Visit (INDEPENDENT_AMBULATORY_CARE_PROVIDER_SITE_OTHER): Payer: Medicaid - Out of State | Admitting: Adult Health

## 2016-01-21 ENCOUNTER — Other Ambulatory Visit (INDEPENDENT_AMBULATORY_CARE_PROVIDER_SITE_OTHER): Payer: Self-pay

## 2016-01-21 ENCOUNTER — Encounter: Payer: Self-pay | Admitting: Adult Health

## 2016-01-21 VITALS — BP 112/70 | HR 90 | Temp 98.3°F | Ht 59.0 in | Wt 89.0 lb

## 2016-01-21 DIAGNOSIS — R1011 Right upper quadrant pain: Secondary | ICD-10-CM

## 2016-01-21 DIAGNOSIS — J208 Acute bronchitis due to other specified organisms: Secondary | ICD-10-CM

## 2016-01-21 DIAGNOSIS — J849 Interstitial pulmonary disease, unspecified: Secondary | ICD-10-CM | POA: Diagnosis not present

## 2016-01-21 DIAGNOSIS — J209 Acute bronchitis, unspecified: Secondary | ICD-10-CM | POA: Insufficient documentation

## 2016-01-21 LAB — CBC WITH DIFFERENTIAL/PLATELET
BASOS PCT: 0.3 % (ref 0.0–3.0)
Basophils Absolute: 0 10*3/uL (ref 0.0–0.1)
EOS PCT: 1.5 % (ref 0.0–5.0)
Eosinophils Absolute: 0.1 10*3/uL (ref 0.0–0.7)
HEMATOCRIT: 32.9 % — AB (ref 36.0–46.0)
HEMOGLOBIN: 10.9 g/dL — AB (ref 12.0–15.0)
LYMPHS PCT: 34.2 % (ref 12.0–46.0)
Lymphs Abs: 1.7 10*3/uL (ref 0.7–4.0)
MCHC: 33.2 g/dL (ref 30.0–36.0)
MCV: 85.9 fl (ref 78.0–100.0)
MONO ABS: 0.2 10*3/uL (ref 0.1–1.0)
MONOS PCT: 4.2 % (ref 3.0–12.0)
Neutro Abs: 3 10*3/uL (ref 1.4–7.7)
Neutrophils Relative %: 59.8 % (ref 43.0–77.0)
Platelets: 288 10*3/uL (ref 150.0–400.0)
RBC: 3.84 Mil/uL — AB (ref 3.87–5.11)
RDW: 13.5 % (ref 11.5–15.5)
WBC: 5.1 10*3/uL (ref 4.0–10.5)

## 2016-01-21 LAB — AMYLASE: AMYLASE: 68 U/L (ref 27–131)

## 2016-01-21 LAB — HEPATIC FUNCTION PANEL
ALBUMIN: 4 g/dL (ref 3.5–5.2)
ALT: 17 U/L (ref 0–35)
AST: 24 U/L (ref 0–37)
Alkaline Phosphatase: 100 U/L (ref 39–117)
Bilirubin, Direct: 0 mg/dL (ref 0.0–0.3)
Total Bilirubin: 0.3 mg/dL (ref 0.2–1.2)
Total Protein: 7.7 g/dL (ref 6.0–8.3)

## 2016-01-21 LAB — SEDIMENTATION RATE: SED RATE: 49 mm/h — AB (ref 0–30)

## 2016-01-21 MED ORDER — AMOXICILLIN 500 MG PO CAPS: 500.0000 mg | ORAL_CAPSULE | Freq: Three times a day (TID) | ORAL | Status: DC

## 2016-01-21 NOTE — Progress Notes (Signed)
Subjective:    Patient ID: Cindy Wheeler, female    DOB: [birth year], 55 y.o.   MRN: [id number]  HPI 55 year old [place] female seen for pulmonary consult 01/12/2016 for interstitial lung disease Previous history of lupus pneumonitis. Previously on high-dose prednisone and CellCept tx for pulmonary TB in 2016 at [facility] TB clinic   Events/TEST  Views pulmonary infiltrates and hypoxemia in February 2016 lived in [place]-treated as lupus pneumonitis moved back to [place] in 09/2015. She ran out of Cell Cept in 09/2015 and Prednisone (30 mg/d) in 11/2015. CXR in 12/2015 > No definite infiltrate seen.  CXR in 02/2015 > mild bibasilar infiltrates (per records) Pt was on 4 meds (Isoniazid, Rifampin, Ethambutol) for 6 mos. Finished sometime end of 2016.   01/21/2016 acute office visit-accompanied by interpreter Patient presents for an acute office visit Pt complains of green colored mucus, left sided pain when coughing, chest discomfort, low grade fever x 2 weeks. Denies any sinus pressure/drainage, SOB, wheezing, nausea or vomiting.  Does not have any insurance .   Currently not any meds for Lupus or TB .  Patient had a high resolution CT chest 01/20/2016 that showed pulmonary parenchymal pattern of mild fibrosis may be due to nonspecific interstitial pneumonitis or UIP. Numerous subcentimeter hazy mediastinal and axillary lymph nodes.  Continues to have stomach pain, seen by GI last week.  She is taking omeprazole and zantac and sulcrafate without much help.  Recent lipase and lft nml    Past Medical History  Diagnosis Date  . Diabetes mellitus without complication (HCC)   . GERD (gastroesophageal reflux disease)   . Hyperlipidemia   . Lupus [facility])    Current Outpatient Prescriptions on File Prior to Visit  Medication Sig Dispense Refill  . omeprazole (PRILOSEC) 20 MG capsule Take 1 capsule (20 mg total) by mouth daily. 30 capsule 0  . ranitidine (ZANTAC) 150 MG tablet Take 150 mg by mouth 2  (two) times daily.    Marland Kitchen HYDROcodone-acetaminophen (NORCO/VICODIN) 5-325 MG tablet Take 2 tablets by mouth every 4 (four) hours as needed. (Patient not taking: Reported on 01/21/2016) 16 tablet 0   No current facility-administered medications on file prior to visit.     Review of Systems    Constitutional:   No  weight loss, night sweats,  Fevers, chills,  +fatigue, or  lassitude.  HEENT:   No headaches,  Difficulty swallowing,  Tooth/dental problems, or  Sore throat,                No sneezing, itching, ear ache,  +nasal congestion, post nasal drip,   CV:  No chest pain,  Orthopnea, PND, swelling in lower extremities, anasarca, dizziness, palpitations, syncope.   GI  No heartburn, indigestion, abdominal pain, nausea, vomiting, diarrhea, change in bowel habits, loss of appetite, bloody stools.   Resp:    No chest wall deformity  Skin: no rash or lesions.  GU: no dysuria, change in color of urine, no urgency or frequency.  No flank pain, no hematuria   MS:  No joint pain or swelling.  No decreased range of motion.  No back pain.  Psych:  No change in mood or affect. No depression or anxiety.  No memory loss.      Objective:   Physical Exam Filed Vitals:   01/21/16 1451  BP: 112/70  Pulse: 90  Temp: 98.3 F (36.8 C)  TempSrc: Oral  Height:  (1.499 m)  Weight: 89 lb (40.37 kg)  SpO2:  96%   GEN: A/Ox3; pleasant , NAD, petite   HEENT:  [place]/AT,  EACs-clear, TMs-wnl, NOSE-clear, THROAT-clear, no lesions, no postnasal drip or exudate noted.   NECK:  Supple w/ fair ROM; no JVD; normal carotid impulses w/o bruits; no thyromegaly or nodules palpated; no lymphadenopathy.  RESP  Clear  P & A; w/o, wheezes/ rales/ or rhonchi.no accessory muscle use, no dullness to percussion  CARD:  RRR, no m/r/g  , no peripheral edema, pulses intact, no cyanosis or clubbing.  GI:   Soft & nt; nml bowel sounds; no organomegaly or masses detected. Tender along RUQ   Musco: Warm bil, no  deformities or joint swelling noted.   Neuro: alert, no focal deficits noted.    Skin: Warm, no lesions or rashes  Stephaie Dardis NP-C  [place] [facility]  01/21/2016       Assessment & Plan:

## 2016-01-21 NOTE — Assessment & Plan Note (Signed)
Flare   Plan   Amoxicillin x 7 days

## 2016-01-21 NOTE — Patient Instructions (Addendum)
Begin Amoxicillin 500mg  Three times a day  For  7 days ,take with food.  Mucinex DM Twice daily  As needed  Cough/congestion  Labs today.  Follow up with Dr. Clayborn BignessdeDios in 2-3 weeks and As needed   Follow up with primary MD for stomach issues or go back to stomach specialist . If worse go to ER .  Please contact office for sooner follow up if symptoms do not improve or worsen or seek emergency care   B?t ??u Amoxicillin 500mg  Ba ngy m?t l?n Trong 7 ngy, dng v?i th?c ?n. Mucinex DM Hai l?n m?i ngy N?u c?n Kh / t?c ngh?n Labs Pulte Homeshm nay. Theo di v?i Dr. Clayborn BignessdeDios trong 2-3 tu?n v n?u c?n Theo di v?i MD ban ??u cho cc v?n ?? v? d? dy ho?c quay tr? l?i chuyn gia v? d? dy. N?u t? h?n ?i ??n ER. Xin vui lng lin h? v?i v?n phng ?? theo di s?m h?n n?u cc tri?u ch?ng khng c?i thi?n ho?c x?u ?i ho?c tm ki?m s? ch?m [place] kh?n c?p

## 2016-01-21 NOTE — Assessment & Plan Note (Addendum)
Follow up with GI or PCP  Check LFT and CBC w/ diff. -

## 2016-01-21 NOTE — Addendum Note (Signed)
Addended by: Karalee HeightOX, Channon Brougher P on: 01/21/2016 03:49 PM   Modules accepted: Orders

## 2016-01-21 NOTE — Addendum Note (Signed)
Addended by: Karalee HeightOX, Jairus Tonne P on: 01/21/2016 03:45 PM   Modules accepted: Orders

## 2016-01-21 NOTE — Assessment & Plan Note (Signed)
ILD changes on CT chest with previous tx aimed at Lupus pneumonitis and also pulmonary TB  Most likely will need FOB going forward Check labs with ANA and ESR .   

## 2016-01-22 ENCOUNTER — Telehealth: Payer: Self-pay | Admitting: Pulmonary Disease

## 2016-01-22 LAB — ANA: ANA: POSITIVE — AB

## 2016-01-22 LAB — ANTI-NUCLEAR AB-TITER (ANA TITER)

## 2016-01-22 NOTE — Telephone Encounter (Signed)
Sheena : just an FYI>   Pt's Chest ct scan with areas of pulmonary fibrosis. (-) active infiltrates seen.  [facility].   Pt was treated for active TB for 6 mos from 07/2015 until 01/2015 and her cultures converted. She was compliant and attained TB cure per ID notes.   Recent cough with tan sputum. Some fevers. No chills.  Likely Bronchitis. Need to R/O PTB reactivation (less likely).   Spoke to friend Bess Harvestom Nguyen as pt does NOT speak English re: recommendations. Plan for bronch on 6/22 Thursday at 7am.  [facility] and will contact pt.   Needs interpreter. Needs PTB isolation > friends updated.    Pollie MeyerJ. Angelo A de Dios, MD 01/22/2016, 12:33 PM [place] [facility] Pager [phone] After 3 pm or if no answer, call [phone]

## 2016-01-25 ENCOUNTER — Telehealth: Payer: Self-pay | Admitting: Pulmonary Disease

## 2016-01-25 NOTE — Progress Notes (Signed)
Quick Note:  Attempted to contact pt. Unable to LVM. ______

## 2016-01-25 NOTE — Telephone Encounter (Signed)
   Received notes from  [facility] in [place].  [address], [place] [id number], [place]  Phone [phone].   Per notes, pt was treated for active TB from 12/09/14 until 06/29/15.  She was given 2 mos of INH, RIF, PYZ daily followed by 4 mos of INH and RIF 3x/week.   Sputum conversion transpired on 01/20/15, 6 weeks into treatment.   She demonstrated radiographic, clinical, and microbiologic conversion.  Pollie MeyerJ. Angelo A de Dios, MD 01/25/2016, 4:53 PM [place] [facility] Pager [phone] After 3 pm or if no answer, call [phone]

## 2016-01-25 NOTE — Progress Notes (Signed)
Quick Note:  Attempted to contact pt. Unable to LVM. ______ 

## 2016-01-27 NOTE — Progress Notes (Signed)
Quick Note:  ATC pt. Received message stating that patient was not excepting calls. WCB. ______

## 2016-01-27 NOTE — Progress Notes (Signed)
Quick Note:  ATC received message stating pt was not taking calls at this time. WCB. ______

## 2016-01-28 ENCOUNTER — Encounter (HOSPITAL_COMMUNITY)
Admission: RE | Disposition: A | Discharge status: Home Health | Payer: Self-pay | Source: Ambulatory Visit | Attending: Internal Medicine

## 2016-01-28 ENCOUNTER — Encounter (HOSPITAL_COMMUNITY): Payer: Self-pay | Admitting: Nurse Practitioner

## 2016-01-28 ENCOUNTER — Inpatient Hospital Stay (HOSPITAL_COMMUNITY)
Admission: RE | Admit: 2016-01-28 | Discharge: 2016-01-30 | DRG: 167 | Disposition: A | Discharge status: Home Health | Payer: Medicaid - Out of State | Source: Ambulatory Visit | Attending: Internal Medicine | Admitting: Internal Medicine

## 2016-01-28 ENCOUNTER — Inpatient Hospital Stay (HOSPITAL_COMMUNITY)
Admission: RE | Admit: 2016-01-28 | Discharge: 2016-01-28 | Disposition: A | Discharge status: Home / Self Care | Payer: Self-pay | Source: Ambulatory Visit

## 2016-01-28 DIAGNOSIS — R1011 Right upper quadrant pain: Secondary | ICD-10-CM | POA: Diagnosis present

## 2016-01-28 DIAGNOSIS — M329 Systemic lupus erythematosus, unspecified: Secondary | ICD-10-CM | POA: Diagnosis present

## 2016-01-28 DIAGNOSIS — J849 Interstitial pulmonary disease, unspecified: Secondary | ICD-10-CM | POA: Diagnosis present

## 2016-01-28 DIAGNOSIS — F329 Major depressive disorder, single episode, unspecified: Secondary | ICD-10-CM | POA: Diagnosis present

## 2016-01-28 DIAGNOSIS — E119 Type 2 diabetes mellitus without complications: Secondary | ICD-10-CM

## 2016-01-28 DIAGNOSIS — E785 Hyperlipidemia, unspecified: Secondary | ICD-10-CM | POA: Diagnosis present

## 2016-01-28 DIAGNOSIS — R0689 Other abnormalities of breathing: Secondary | ICD-10-CM | POA: Diagnosis present

## 2016-01-28 DIAGNOSIS — IMO0002 Reserved for concepts with insufficient information to code with codable children: Secondary | ICD-10-CM | POA: Diagnosis present

## 2016-01-28 DIAGNOSIS — Z711 Person with feared health complaint in whom no diagnosis is made: Secondary | ICD-10-CM | POA: Insufficient documentation

## 2016-01-28 DIAGNOSIS — Z8611 Personal history of tuberculosis: Secondary | ICD-10-CM | POA: Diagnosis present

## 2016-01-28 DIAGNOSIS — K219 Gastro-esophageal reflux disease without esophagitis: Secondary | ICD-10-CM | POA: Diagnosis present

## 2016-01-28 DIAGNOSIS — I10 Essential (primary) hypertension: Secondary | ICD-10-CM

## 2016-01-28 DIAGNOSIS — Z833 Family history of diabetes mellitus: Secondary | ICD-10-CM

## 2016-01-28 DIAGNOSIS — J8410 Pulmonary fibrosis, unspecified: Principal | ICD-10-CM | POA: Diagnosis present

## 2016-01-28 DIAGNOSIS — R45851 Suicidal ideations: Secondary | ICD-10-CM

## 2016-01-28 DIAGNOSIS — F32A Depression, unspecified: Secondary | ICD-10-CM

## 2016-01-28 HISTORY — DX: Essential (primary) hypertension: I10

## 2016-01-28 HISTORY — PX: VIDEO BRONCHOSCOPY: SHX5072

## 2016-01-28 HISTORY — DX: Respiratory tuberculosis unspecified: A15.9

## 2016-01-28 LAB — GLUCOSE, CAPILLARY
Glucose-Capillary: 82 mg/dL (ref 65–99)
Glucose-Capillary: 191 mg/dL — ABNORMAL HIGH (ref 65–99)

## 2016-01-28 SURGERY — VIDEO BRONCHOSCOPY WITHOUT FLUORO
Anesthesia: Moderate Sedation | Laterality: Bilateral

## 2016-01-28 MED ORDER — FENTANYL CITRATE (PF) 100 MCG/2ML IJ SOLN
INTRAMUSCULAR | Status: AC
  Filled 2016-01-28: qty 4

## 2016-01-28 MED ORDER — SODIUM CHLORIDE 0.9 % IV SOLN: INTRAVENOUS | Status: DC

## 2016-01-28 MED ORDER — LIDOCAINE HCL (PF) 1 % IJ SOLN
INTRAMUSCULAR | Status: DC | PRN
  Administered 2016-01-28: 6 mL

## 2016-01-28 MED ORDER — ACETAMINOPHEN 650 MG RE SUPP: 650.0000 mg | Freq: Four times a day (QID) | RECTAL | Status: DC | PRN

## 2016-01-28 MED ORDER — ACETAMINOPHEN 325 MG PO TABS: 650.0000 mg | ORAL_TABLET | Freq: Four times a day (QID) | ORAL | Status: DC | PRN

## 2016-01-28 MED ORDER — BISACODYL 5 MG PO TBEC: 5.0000 mg | DELAYED_RELEASE_TABLET | Freq: Every day | ORAL | Status: DC | PRN

## 2016-01-28 MED ORDER — FENTANYL CITRATE (PF) 100 MCG/2ML IJ SOLN: 25.0000 ug | INTRAMUSCULAR | Status: DC | PRN

## 2016-01-28 MED ORDER — TRAZODONE 25 MG HALF TABLET
25.0000 mg | ORAL_TABLET | Freq: Every evening | ORAL | Status: DC | PRN
  Filled 2016-01-28: qty 1

## 2016-01-28 MED ORDER — HYDROCODONE-ACETAMINOPHEN 5-325 MG PO TABS
1.0000 | ORAL_TABLET | Freq: Four times a day (QID) | ORAL | Status: DC | PRN
  Administered 2016-01-28 – 2016-01-30 (×2): 1 via ORAL
  Filled 2016-01-28 (×2): qty 1

## 2016-01-28 MED ORDER — POLYETHYLENE GLYCOL 3350 17 G PO PACK: 17.0000 g | PACK | Freq: Every day | ORAL | Status: DC | PRN

## 2016-01-28 MED ORDER — METHYLPREDNISOLONE SODIUM SUCC 125 MG IJ SOLR
INTRAMUSCULAR | Status: AC
  Administered 2016-01-28: 40 mg via INTRAVENOUS
  Filled 2016-01-28: qty 2

## 2016-01-28 MED ORDER — FENTANYL CITRATE (PF) 100 MCG/2ML IJ SOLN
25.0000 ug | INTRAMUSCULAR | Status: DC | PRN
  Administered 2016-01-28 (×2): 50 ug via INTRAVENOUS

## 2016-01-28 MED ORDER — METHYLPREDNISOLONE SODIUM SUCC 125 MG IJ SOLR
40.0000 mg | Freq: Once | INTRAMUSCULAR | Status: AC
  Administered 2016-01-28: 40 mg via INTRAVENOUS

## 2016-01-28 MED ORDER — NALOXONE HCL 0.4 MG/ML IJ SOLN
0.4000 mg | INTRAMUSCULAR | Status: DC | PRN
  Administered 2016-01-28: 0.4 mg via INTRAVENOUS

## 2016-01-28 MED ORDER — MIDAZOLAM HCL 5 MG/ML IJ SOLN
INTRAMUSCULAR | Status: AC
  Filled 2016-01-28: qty 2

## 2016-01-28 MED ORDER — SODIUM CHLORIDE 0.9 % IV SOLN
INTRAVENOUS | Status: DC
  Administered 2016-01-28 – 2016-01-29 (×3): via INTRAVENOUS

## 2016-01-28 MED ORDER — MIDAZOLAM HCL 5 MG/ML IJ SOLN
INTRAMUSCULAR | Status: DC | PRN
  Administered 2016-01-28: 2 mg via INTRAVENOUS
  Administered 2016-01-28: 1 mg via INTRAVENOUS

## 2016-01-28 MED ORDER — LIDOCAINE HCL 2 % EX GEL
CUTANEOUS | Status: DC | PRN
  Administered 2016-01-28: 1

## 2016-01-28 MED ORDER — ENOXAPARIN SODIUM 40 MG/0.4ML ~~LOC~~ SOLN
40.0000 mg | SUBCUTANEOUS | Status: DC
  Administered 2016-01-28: 40 mg via SUBCUTANEOUS
  Filled 2016-01-28: qty 0.4

## 2016-01-28 MED ORDER — ONDANSETRON HCL 4 MG/2ML IJ SOLN: 4.0000 mg | Freq: Four times a day (QID) | INTRAMUSCULAR | Status: DC | PRN

## 2016-01-28 MED ORDER — ONDANSETRON HCL 4 MG PO TABS: 4.0000 mg | ORAL_TABLET | Freq: Four times a day (QID) | ORAL | Status: DC | PRN

## 2016-01-28 MED ORDER — PHENYLEPHRINE HCL 0.25 % NA SOLN
NASAL | Status: DC | PRN
  Administered 2016-01-28: 2 via NASAL

## 2016-01-28 NOTE — Progress Notes (Signed)
Security did remove some medication from patient belongings:  1 bottle omeprazole 1 bottle ranitidine 1 bottle sulfam/trimet   Placed in a separate bag.  Will give to admitting nurse along with the other belongings.

## 2016-01-28 NOTE — Progress Notes (Signed)
Admission note:  Arrival Method:Patient arrived to the unit in Bed with interpreter and sitter with RT  Mental Orientation: Alert, oriented and cooperative Telemetry: none Assessment: completed, needs assessed Skin: intact IV: right arm Pain: denies Tubes: none Safety Measures: Room checked with 2nd RN for suicide precautions, all pertinent devices removed. Fall Prevention Safety Plan: in place, sitter with patient. Admission Admission note: Patient comfortable, belongings removed, needs assessed, support given.

## 2016-01-28 NOTE — Progress Notes (Signed)
Patient ID: Cindy Wheeler, female   DOB: 06/07/1961, 55 y.o.   MRN: [id number]   Received psych consult and reviewed electronic chart. Patient is not available for the evaluation being in procedure today. Will ask psych LCSW to arrange a vietnamese translator for the psychiatric consultation and evaluation.    Glinda Natzke 01/28/2016 10:42 AM

## 2016-01-28 NOTE — Care Management Note (Signed)
Case Management Note  Patient Details  Name: Cordie GriceCam Laventure MRN: [id number] Date of Birth: [birth year]  Subjective/Objective:          CM following for progression and d/c planning.           Action/Plan: 01/28/2016 Noted consult to CM to assist with d/c needs. Spoke with CSW, V Crawford. Will try to coordinate efforts to work with this pt as an interpreter will be needed so that we both receive the same information.  Noted that pt Medicaid is out of state this will make it very difficult to arrange any [facility] services, however if pt is positive for TB, services and medications will most likely be provided by [facility]. Will continue to follow.   Expected Discharge Date:                  Expected Discharge Plan:  Home w Home Health Services  In-House Referral:  Clinical Social Work  Discharge planning Services  CM Consult  Post Acute Care Choice:    Choice offered to:     DME Arranged:    DME Agency:     HH Arranged:    HH Agency:     Status of Service:  In process, will continue to follow  If discussed at Long Length of Stay Meetings, dates discussed:    Additional Comments:  Starlyn SkeansRoyal, Pragya Lofaso U, RN 01/28/2016, 3:29 PM

## 2016-01-28 NOTE — Progress Notes (Signed)
Patient going to be admitted for suicide precautions.  AC called to advise on proper protocol.  Advised to call security to go through belongings.  Security here at this time to do so.  All unnecessary equipment will be removed from room.  All belongings have been cleared.  Waiting on bed placement.

## 2016-01-28 NOTE — Progress Notes (Signed)
Video bronchoscopy performed.  Intervention bronchial washing.  Patient had some hypoventilation post procedure.  Reversal agent given, bag mask ventilation performed.  SpO2 and etCO2 returned to normal.

## 2016-01-28 NOTE — Consult Note (Addendum)
PULMONARY / CRITICAL CARE MEDICINE   Name: Cindy Wheeler MRN: [id number] DOB: Apr 27, 1961    ADMISSION DATE:  01/28/2016 CONSULTATION DATE:  01/28/16  REFERRING MD:  Dr. Konrad Dolores  CHIEF COMPLAINT:  Pt being R/O for PTB  HISTORY OF PRESENT ILLNESS:    This is the case of Cindy Wheeler, 55 y.o. Female, who was first referred by Dr. Tish Men in consultation regarding dyspnea and her lung issues back on June 01/2016.  I saw her at the office.   Dr, Evelena Peat is asking PCCM to see pt in consultation regarding her being worked up for active PTB. She had an outpt bronchoscopy on 6/22 which pt tolerated over all. Had somnolence and o2 desatn after the procedure (received 4 mg versed and 100 mcg fentanyl) which improved with 4 mg of narcan.  As part of the pre-op assessment, we asked about depression and suicide thoughts and pt STATED THAT SHE IS DEPRESSED AND FELT HOPELESS AND TRIED TO COMMIT SUICIDE. Decision was to admit her under [facility] service after the procedure.   Patient is [place] and I was able to communicate with her with an interpreter.  Pt was recently seen at ED for abd pain. Given her lung issues, she was sent to our office to be seen.   Pt has been in the [place] since 2003. She denies any lung issues until 2016. No history of asthma, copd, PTB until 2016. She has been in [place] since 2003. She visited [place] in 2015 and she stayed there for 3 mos. While she was visiting, she end up being sick -- had fever, chills, weight loss, cough, dyspnea, joint pains. She came back to the [place] in 09/2014. Her first stop was [place], [place]. I think she was very symptomatic so she ended up seeing doctors and was admitted at [facility]. Pt was seen by Pulm and ID. She had acute hypoxemic respiratory failure and diffuse pulmonary infiltrates. She end up having bronchoscopy.   Pt was treated as lupus pneumonitis while in [place]. She ended up being on high steroid and prednisone doses as well as  CellCept and Bactrim for PCP prophylaxis. She was being seen by Dr. Melanie Crazier. The last office note by Dr. Michel Bickers (March 05, 2015) stated that pt had clinically and radiographically improved with prednisone (she was on 30 mg/d) as well as CellCept (500 mg in am and 1000 mg in pm). The plan was to wean off prednisone. She would need to see pulmonary as she had planned to move back to [place].   Pt also ended up being (+) for PTB with the BAL from bronchoscopy.  We received notes from  [facility] program in [place].  [address], [place] [address], [place] Phone [phone].  Per notes, pt was treated for active TB from 12/09/14 until 06/29/15.  She was given 2 mos of INH, RIF, PYZ daily followed by 4 mos of INH and RIF 3x/week.  Sputum conversion transpired on 01/20/15, 6 weeks into treatment.  She demonstrated radiographic, clinical, and microbiologic conversion.  Pt ended up coming back to [facility] in 09/2015. She was NOT able to see any MD until her ER visit end of May 2017. She ran out of prednisone in 11/2015. She also was not able to get any CellCept since 09/2015. She has remained asymptomatic until end of May 2017.   She started having episodic substernal discomfort. That resolved after < 1 week. She then had RUQ episodic pain which she  describes bothers her with inspiration. Food also made it worse. She presented to ED with this and no cause was found for her RUQ pain.   She continues to have RUQ/ R lower chest pleuritic pain.  Has on and off cough productive of green sputum.  Also with low grade fevers.  Pt has been nauseated as well and has lost maybe 5 lbs the last 2-3 weeks.  Her Chest CT scan was (-) for any active obvious infiltrate.  She had fibrosis/scarring at the bases.  GI service saw her and they thought everything was related to pulmonary.  Decision was made to do a Dx bronchoscopy to R/O PTB, other infection.    PAST MEDICAL HISTORY :  She  has  a past medical history of Diabetes mellitus without complication (HCC); Hyperlipidemia; Lupus (HCC); Hypertension; and Tuberculosis.  GERD. (-) CA, DVT. Denies any PTB exposure until 09/2014  PAST SURGICAL HISTORY: She  has past surgical history that includes No past surgeries.  No Known Allergies  No current facility-administered medications on file prior to encounter.   Current Outpatient Prescriptions on File Prior to Encounter  Medication Sig  . amoxicillin (AMOXIL) 500 MG capsule Take 1 capsule (500 mg total) by mouth 3 (three) times daily.  Marland Kitchen. HYDROcodone-acetaminophen (NORCO/VICODIN) 5-325 MG tablet Take 2 tablets by mouth every 4 (four) hours as needed.  Marland Kitchen. omeprazole (PRILOSEC) 20 MG capsule Take 1 capsule (20 mg total) by mouth daily.  . ranitidine (ZANTAC) 150 MG tablet Take 150 mg by mouth 2 (two) times daily.    FAMILY HISTORY:  Her has no family status information on file.   Mother is deceased. She had DM. Father is healthy.  (-) PTB in family.   SOCIAL HISTORY: She  reports that she has never smoked. She has never used smokeless tobacco. She reports that she does not drink alcohol or use illicit drugs.   From [place], been in [place] since 2003. 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 (-) smoke, ETOH.  Single, no children. Lives alone. Has sisters/family in [place]  REVIEW OF SYSTEMS:   As above. With low grade fevers (undocumented), cough, SOB, pleuritic RUQ pain, nausea, anorexia,  Rest of 14 point ROS done and everything else (-).   SUBJECTIVE:  As above. Comfortable post bronchoscopy. (-) other issues,   VITAL SIGNS: BP 119/60 mmHg  Pulse 84  Temp(Src) 98 F (36.7 C) (Oral)  Resp 19  Ht 4\' 11"  (1.499 m)  Wt 40.37 kg (89 lb)  BMI 17.97 kg/m2  SpO2 94%  HEMODYNAMICS:    VENTILATOR SETTINGS:    INTAKE / OUTPUT:    PHYSICAL EXAMINATION: General:  Awake, oriented x 3. NAD. Comfortable.  Neuro:  CN grossly intact. (-) lateralizing sings.  HEENT:   (-) NVD. (-) oral thrush. Airway class 3.  Cardiovascular:  Good s1/s2. (-) s3/m/r/g Lungs:  Fair ae. Some wheezing RUL post bronch. Bibasilar crackles.  Abdomen:  (+) BS soft NT (-) masses/tenderness.  Musculoskeletal:  (-) edema/clubbing/cyanosis Skin:  Warm/dry. (-) rashes.   LABS:  BMET No results for input(s): NA, K, CL, CO2, BUN, CREATININE, GLUCOSE in the last 168 hours.  Electrolytes No results for input(s): CALCIUM, MG, PHOS in the last 168 hours.  CBC  Recent Labs Lab 01/21/16 1545  WBC 5.1  HGB 10.9*  HCT 32.9*  PLT 288.0    Coag's No results for input(s): APTT, INR in the last 168 hours.  Sepsis Markers No results for input(s): LATICACIDVEN, PROCALCITON,  O2SATVEN in the last 168 hours.  ABG No results for input(s): PHART, PCO2ART, PO2ART in the last 168 hours.  Liver Enzymes  Recent Labs Lab 01/21/16 1545  AST 24  ALT 17  ALKPHOS 100  BILITOT 0.3  ALBUMIN 4.0    Cardiac Enzymes No results for input(s): TROPONINI, PROBNP in the last 168 hours.  Glucose  Recent Labs Lab 01/28/16 0649  GLUCAP 82    Imaging No results found.   STUDIES:    CULTURES: BAL AFB, cultures, cytology, etc 6/22 >>  ANTIBIOTICS: (-)  SIGNIFICANT EVENTS: 6/22 pt had outpt bronch as part of PTB work up. Admitted for suicide ideation/depression. PCCM following to follow up on AFB.   LINES/TUBES:   DISCUSSION: 689 Vietnamese with social issues, with recent Lupus pneumonitis flare and PTB treatment (2016) over [place], admitted after outpt bronchoscopy as she expressed being suicidal and was depressed. She needs Psychiatric admission but [facility].  She is being R/O for PTB.   ASSESSMENT / PLAN:  PULMONARY A: R/O Active PTB PTB treatment from May-November 2016 with radiographic, clinical, and microbiologic conversion H/O Lupus Pneumonitis (improved with Prednisone and Cellcept) Pulmonary Fibrosis at the bases RUQ  pain P:   Await BAL for AFB stain and culture, gram stain, fungal studies, other studies and cytology.  Once AFB stain is (-) ( hopefully in 1-2 days), we can d/c (-) pressure isolation and pt can be transferred to a psychiatric facility.  I will hold off on Abx for now pending cultures. Pt ran out of Prednisone (30 mg/d) and Cellcept (1500 mg/d) start of the year.  I want to make sure there is no active infection before she is placed on prednisone/cellcept.    C FAMILY  - Updates: nephew Dannial MonarchLenny E. Has been updated after the bronchoscopy. Pt needs Case management involved. Needs urgent medicaid.   - Inter-disciplinary family meet or Palliative Care meeting due by:  6/29   J. Alexis FrockAngelo A de Dios, MD Pulmonary and Critical Care Medicine [facility] Pager: [phone] After 3 pm or if no response, call [phone]  01/28/2016, 12:33 PM

## 2016-01-28 NOTE — Procedures (Addendum)
Video Bronchoscopy Procedure Note  Date of Operation: 01/28/2016  Pre-op Diagnosis: ILD related to SLE. Recent PTB. R/O reactivation  Post-op Diagnosis: Same  Surgeon: Garnetta BuddyJose Angelo de Dios, MD  Assistants: Tyler Pitaammie Readding and Lysbeth PennerMary Smith   Anesthesia:  moderate sedation  Meds Given: fentanyl 100 mcg, versed 3 mg in divided doses,  4 cc total, 1% lidocaine, 0.4 mg of narcan. First versed was given at 7:48am  Operation: Flexible video fiberoptic bronchoscopy. BAL over at RML.   Estimated Blood Loss: 0 cc  Complications: Pt ended up with bradypnea after the procedure.  ETCO2 was in 50s from mid 30s.  We gave her 1 amp of narcan (0.4 mg)  and she eventually woke up. We had to place her on a NRBM for which we eventually weaned it off.   Indications and History: Cindy Wheeler is 55 y.o. with history of SLE Pneumonitis and PTB adequately treated in 2016.  Recommendation was to perform video fiberoptic bronchoscopy with biopsies. The risks, benefits, complications, treatment options and expected outcomes were discussed with the patient.  The possibilities of pneumothorax, pneumonia, hypoxemia, reaction to medication, pulmonary aspiration, perforation of a viscus, bleeding, failure to diagnose a condition and creating a complication requiring transfusion or operation were discussed with the patient who freely signed the consent.    Description of Procedure: The patient was seen in the Preoperative Area, was examined and was deemed appropriate to proceed.  The patient was taken to bronchoscopy suite, identified as Cindy Wheeler and the procedure verified as Flexible Video Fiberoptic Bronchoscopy.  A Time Out was held and the above information confirmed.   Conscious sedation was initiated as indicated above. The video fiberoptic bronchoscope was introduced via the R nare and a general inspection was performed which showed normal cords, normal trachea, normal main carina. The R sided airways were inspected and  showed normal RUL.  RML and RLL mucosa was inflammed with some serous discharge. (-) endobronchial lesion seen.  The L side was then inspected. The LUL airways were normal. Lingula and LLL airways were erythematous. (-) Endobronchial lesion seen.   BAL done over at RML with 60 cc of saline with more than 50% return.    I left the patient room at 8:35 am   Procedure : 1. Bronchoscopy 2. BAL, RML 3. Moderate Sedation. 47 minutes total.   Samples: 1. Bronchial washings/ BAL from RML.   Plans:  1. We will review the cytology, pathology and microbiology results with the patient when they become available. 2.  Pt needs to be on TB precaution/isolation.  3.  During the pre assessment questions, pt stated that SHE IS DEPRESSED, FELT VERY HOPELESS, AND TRIED KILLING HERSELF. Because of this, pt needs to be admitted to an inpatient psychiatry facility.  We need to keep her in a (-) pressure isolation until BAL AFB is back -- hopefully in 1-2 days.  Once AFB is back and is (-), she can be transferred to psych facility if still deemed necessary. I have discussed the case with Lucrezia EuropePaula Gunther, NP who works with [facility].  Pt will be admitted under Dr. Evelena PeatMerrel,  Will need suicide precautions. I spoke to pt's nephew Cherlyn CushingLenny Enuol and updated him on the plans to admit the pt.    Pollie MeyerJ. Angelo A de Dios, MD 01/28/2016, 8:54 AM [place] [facility] Pager [phone] After 3 pm or if no answer, call [phone]        [facility] Angelo A. PaoliDe Dios,  MD 01/28/2016, 8:45 AM [place] [facility]

## 2016-01-28 NOTE — Consult Note (Signed)
[facility] Face-to[facility]Face Psychiatry Consult   Reason for Consult:  Depression and suicide ideation Referring Physician:  Dr. Konrad DoloresMerrell Patient Identification: Cindy GriceCam Wheeler MRN:  [id number] Principal Diagnosis: Suicide ideation Diagnosis:   Patient Active Problem List   Diagnosis Date Noted  . History of tuberculosis [Z86.11] 01/28/2016  . Essential hypertension [I10] 01/28/2016  . Diabetes mellitus, type 2 (HCC) [E11.9] 01/28/2016  . Depression [F32.9] 01/28/2016  . Suicide ideation [R45.851] 01/28/2016  . Acute bronchitis [J20.9] 01/21/2016  . ILD (interstitial lung disease) (HCC) [J84.9] 01/13/2016  . TB (pulmonary tuberculosis) [A15.0] 01/13/2016  . Lupus (HCC) [M32.9] 01/13/2016  . RUQ pain [R10.11] 01/13/2016    Total Time spent with patient: 1 hour  Subjective:   Cindy Wheeler is a 55 y.o. female patient admitted with depression with suicide ideation.  HPI:  Cindy Wheeler is a [age] y.o. [place], non-English speaking female, seen, chart reviewed and completed face-to-face psychiatric consultation and evaluation with the help of [place] translator on phone. Patient reported she has been living in [place] for the last 14 years and used to work for the [employer]. Patient appeared sitting on her bed, awake, alert, oriented to time place person and situation. Patient is calm and cooperative. Patient has normal speech and good attitude. Patient reportedly visited [place] 2 years ago and got infection become sick and required treatment [facility]. Patient return after being treated in the hospital for tuberculosis and staying with her sister and her family. Patient stated she become sick and does not have a primary care provider or medication to take. Patient did initially endorsed being depressed and having suicidal ideation with intention to take overdose of medication given that she has no medications and her position. Patient reported since admitted to the hospital her symptoms are getting  improved and has adequate care she required and feels much better at this time. Patient denies current symptoms of depression, anxiety, mania, PTSD, auditory/visual hallucinations, delusions or paranoia. Patient stated she has no current suicidal ideation, intention or plans and contract for safety while in the hospital. Patient is hoping to go back and live with her sister upon discharge from the hospital but hoping case managers will be able to provide support she needed especially financial support for buying her basic needs, food, transportation back and forth to the doctor's office and ability to buy her medication needed. Patient denied previous history of acute psychiatric hospitalization or outpatient medication management.  Medical history: Patient has been in [place] since 2003 but went back to visit the [place] in 2015. While in [place] patient became ill with fevers/chills/weight loss and arthralgias. She returned to the states in February 2016, for unclear reasons she [facility] [place] prior to returning to [place]. Patient was hospitalized in [place] with respiratory failure/ pulmonary infiltrates. Patient was treated empirically for lupus pneumonitis with a regimen of steroids, CellCept as well as Bactrim for PCP prophylaxis. Patient had a bronchoscopy which was positive for TB, she was treated for 6 months. [facility] [place]. She eventually ran out of medication supply, subsequently developed right upper quadrant pain / substernal discomfort for which she was evaluated in the emergency department in May. At some point, patient was referred to pulmonary who obtained a chest CT. Scan showed fibrosis, no abnormalities in the right upper quadrant. Patient was subsequently referred to GI for evaluation of the RUQ pain who tried to get an ultrasound scheduled but transportation difficulties complicated things.   Patient underwent bronchoscopy  today. This  was done because of history of TB, complaints of fevers, mild cough. It was determined in the history of present illness the patient was depressed, suicidal and w weere asked to admit the patient. Patient has been thinking about killing herself for a few weeks now because she has no financial or social support. She would like to overdose on medication but has no available pills at her disposal. Patient denies previous history of depression or suicidal ideation.   She complains of joint pains, specifically both hands, feet and knees. She describes difficulty walking.. No cough. Patient has a several week history of right upper quadrant pain. The pain is exacerbated by twisting, bending as well as eating. No associated nausea or vomiting. She reports a 5 pound weight loss over the last few weeks. No problems with bowel habits. No urinary complaints.  Past Psychiatric History: Patient has no history of acute psychiatric hospitalization or outpatient medication management since 2003.  Risk to Self: Is patient at risk for suicide?: Yes Risk to Others:   Prior Inpatient Therapy:   Prior Outpatient Therapy:    Past Medical History:  Past Medical History  Diagnosis Date  . Diabetes mellitus without complication (HCC)   . GERD (gastroesophageal reflux disease)   . Hyperlipidemia   . Lupus [facility](HCC)     Past Surgical History  Procedure Laterality Date  . No past surgeries     Family History:  Family History  Problem Relation Age of Onset  . Diabetes Mother    Family Psychiatric  History: Patient denied family history of mental illness and she stated her sister and her family's supportive to her. Patient sister has 2 children. Social History:  History  Alcohol Use No     History  Drug Use No    Social History   Social History  . Marital Status: Single    Spouse Name: N/A  . Number of Children: 0  . Years of Education: N/A   Social History Main Topics  . Smoking status: Never Smoker   .  Smokeless tobacco: Never Used  . Alcohol Use: No  . Drug Use: No  . Sexual Activity: Not on file   Other Topics Concern  . Not on file   Social History Narrative   Additional Social History:    Allergies:  No Known Allergies  Labs:  Results for orders placed or performed during the hospital encounter of 01/28/16 (from the past 48 hour(s))  Glucose, capillary     Status: None   Collection Time: 01/28/16  6:49 AM  Result Value Ref Range   Glucose-Capillary 82 65 - 99 mg/dL    Current Facility-Administered Medications  Medication Dose Route Frequency Provider Last Rate Last Dose  . 0.9 %  sodium chloride infusion   Intravenous Continuous Jose Angelo A de Dios, MD      . 0.9 %  sodium chloride infusion   Intravenous Continuous Meredith PelPaula M Guenther, NP      . acetaminophen (TYLENOL) tablet 650 mg  650 mg Oral Q6H PRN Meredith PelPaula M Guenther, NP       Or  . acetaminophen (TYLENOL) suppository 650 mg  650 mg Rectal Q6H PRN Meredith PelPaula M Guenther, NP      . bisacodyl (DULCOLAX) EC tablet 5 mg  5 mg Oral Daily PRN Meredith PelPaula M Guenther, NP      . Melene Muller[START ON 01/29/2016] enoxaparin (LOVENOX) injection 40 mg  40 mg Subcutaneous Q24H Meredith PelPaula M Guenther, NP      .  fentaNYL (SUBLIMAZE) injection 25 mcg  25 mcg Intravenous PRN Jose Alexis Frock, MD   50 mcg at 01/28/16 0752  . naloxone [facility])) injection 0.4 mg  0.4 mg Intravenous PRN Louann Sjogren, MD   0.4 mg at 01/28/16 0827  . ondansetron (ZOFRAN) tablet 4 mg  4 mg Oral Q6H PRN Meredith Pel, NP       Or  . ondansetron [facility]) injection 4 mg  4 mg Intravenous Q6H PRN Meredith Pel, NP      . polyethylene glycol (MIRALAX / GLYCOLAX) packet 17 g  17 g Oral Daily PRN Meredith Pel, NP      . traZODone (DESYREL) tablet 25 mg  25 mg Oral QHS PRN Meredith Pel, NP        Musculoskeletal: Strength & Muscle Tone: decreased Gait & Station: normal Patient leans: N/A  Psychiatric Specialty Exam: Physical Exam as per history and physical   ROS  has some left sided chest pain but denied shortness of breath. Patient has no nausea or vomiting or headache No Fever-chills, No Headache, No changes with Vision or hearing, reports vertigo No problems swallowing food or Liquids, No Chest pain, Cough or Shortness of Breath, No Abdominal pain, No Nausea or Vommitting, Bowel movements are regular, No Blood in stool or Urine, No dysuria, No new skin rashes or bruises, No new joints pains-aches,  No new weakness, tingling, numbness in any extremity, No recent weight gain or loss, No polyuria, polydypsia or polyphagia,   A full 10 point Review of Systems was done, except as stated above, all other Review of Systems were negative.  Blood pressure 104/48, pulse 77, temperature 98 F (36.7 C), temperature source Oral, resp. rate 17, height  (1.499 m), weight 40.37 kg (89 lb), SpO2 93 %.Body mass index is 17.97 kg/(m^2).  General Appearance: Casual  Eye Contact:  Good  Speech:  Clear and Coherent and Normal Rate  Volume:  Normal  Mood:  Anxious  Affect:  Appropriate and Congruent  Thought Process:  Coherent and Goal Directed  Orientation:  Full (Time, Place, and Person)  Thought Content:  Logical  Suicidal Thoughts:  No  Homicidal Thoughts:  No  Memory:  Immediate;   Good Recent;   Good Remote;   Good  Judgement:  Intact  Insight:  Good  Psychomotor Activity:  Decreased  Concentration:  Concentration: Good and Attention Span: Good  Recall:  Good  Fund of Knowledge:  Good  Language:  Nonspeaking [place] who speaks [place] fluently.  Akathisia:  Negative  Handed:  Right  AIMS (if indicated):     Assets:  Communication Skills Desire for Improvement Housing Leisure Time Resilience Social Support  ADL's:  Intact  Cognition:  WNL  Sleep:        Treatment Plan Summary: Patient has been suffering with depression, anxiety and passive suicidal ideation with the plan of overdose on Ativan. Since patient has been  medically getting but better with our pulmonary tuberculosis and acute bronchitis she started feeling less depressed, anxious and denies current suicidal/homicidal ideation, intention or plans. Patient contract for safety while in the hospital.  Patient is also seeking psychosocial support services including EBT card, financial assistance for medication and transported patient to the primary care physician's   Patient will benefit from cognitive behavioral therapy and supportive therapy when medically stable.  Safety concerns patient contract for safety and denies active suicidal ideation, intention or plans.  No psychotropic  medication was recommended at this time  Refer to the case manager about the hospital for appropriate psychosocial support services.   Appreciate psychiatric consultation and we sign off as of today Please contact [phone] or [phone] if needs further assistance   Disposition: No evidence of imminent risk to self or others at present.   Patient does not meet criteria for psychiatric inpatient admission. Supportive therapy provided about ongoing stressors.  Leata Mouse, MD 01/28/2016 10:19 AM

## 2016-01-28 NOTE — H&P (Signed)
History and Physical    Cindy GriceCam Runyan WUJ:[id number] DOB: [birth year] DOA: 01/28/2016  PCP: No PCP Per Patient  Patient coming from:  home    Chief Complaint: depression / suicide ideation.   HPI: Cindy Wheeler is a 55 y.o. [place], non-English speaking female.  History obtained with help of [place] translator, from [facility] records and PCCM ( Dr. Lucy Chrisios) who is requesting admission for suicidal ideation.  Apparently, patient has been in [place] since 2003 but went back to visit [place] in 2015. While in [place] patient became ill with fevers/chills/weight loss and arthralgias. She returned to the states in February 2016, for unclear reasons she went [place] [facility] [place] prior to returning to [place]. Patient was hospitalized in [place] respiratory failure/ pulmonary infiltrates. Patient was treated empirically for lupus pneumonitis with a regimen of steroids, CellCept, as well as Bactrim for PCP prophylaxis. Patient had a bronchoscopy which was positive for TB, she was treated for 6 months. [place] [facility] in February [place]. She eventually ran out of medication supply, subsequently developed right upper quadrant pain  / substernal discomfort for which she was evaluated in the emergency department in May. At some point patient was referred to pulmonary who obtained a chest CT. Scan showed fibrosis, no abnormalities in the right upper quadrant. Patient was subsequently referred to GI for evaluation of the RUQ pain who tried to get an ultrasound scheduled but transportation difficulties complicated things. She is taking Zantac and Omeprazole but denies hx of GERD.   Patient underwent bronchoscopy today. This was done because of history of TB, complaints of fevers, mild cough. It was determined in the history of present illness the patient was depressed, suicidal and w weere asked to admit the patient.  Patient has been thinking about killing herself for a few weeks now because she has no  financial or social support. She would like to overdose on medication but has no available pills at her disposal. Patient denies previous history of depression or suicidal ideation.   She complains of joint pains, specifically both hands, feet and knees. She describes difficulty walking.. No cough. Patient has a several week history of right upper quadrant pain. The pain is exacerbated by twisting, bending as well as eating. No associated nausea or vomiting. She reports a 5 pound weight loss over the last few weeks. No problems with bowel habits. No urinary complaints.  ED Course:  NA  Review of Systems: As per HPI, otherwise 10 point review of systems negative.    Past Medical History  Diagnosis Date  . Diabetes mellitus without complication (HCC)   . Hyperlipidemia   . Lupus (HCC)   . Hypertension   . Tuberculosis     Past Surgical History  Procedure Laterality Date  . No past surgeries      Social History   Social History  . Marital Status: Single    Spouse Name: N/A  . Number of Children: 0  . Years of Education: N/A   Occupational History  . Not on file.   Social History Main Topics  . Smoking status: Never Smoker   . Smokeless tobacco: Never Used  . Alcohol Use: No  . Drug Use: No  . Sexual Activity: Not on file   Other Topics Concern  . Not on file   Social History Narrative    No Known Allergies  Family History  Problem Relation Age of Onset  . Diabetes Mother    . Lives at home with sister. Remainder  of family in [place]. No assistive devices needed for ambulation  Prior to Admission medications   Medication Sig Start Date End Date Taking? Authorizing Provider  amoxicillin (AMOXIL) 500 MG capsule Take 1 capsule (500 mg total) by mouth 3 (three) times daily. 01/21/16  Yes Tammy S Parrett, NP  HYDROcodone-acetaminophen (NORCO/VICODIN) 5-325 MG tablet Take 2 tablets by mouth every 4 (four) hours as needed. 01/05/16  Yes Jerre Simon, PA  omeprazole  (PRILOSEC) 20 MG capsule Take 1 capsule (20 mg total) by mouth daily. 01/05/16  Yes Jessica L Focht, PA  ranitidine (ZANTAC) 150 MG tablet Take 150 mg by mouth 2 (two) times daily.   Yes Historical Provider, MD    Physical Exam: Filed Vitals:   01/28/16 0830 01/28/16 0835 01/28/16 0840 01/28/16 0845  BP: 129/66 137/68 128/56   Pulse: 85 85 84 81  Temp:      TempSrc:      Resp: Height:      Weight:      SpO2: 96% 92% 93% 92%    Constitutional:  Petite Asian female lying in bed in NAD, calm, comfortable Filed Vitals:   01/28/16 0830 01/28/16 0835 01/28/16 0840 01/28/16 0845  BP: 129/66 137/68 128/56   Pulse: 85 85 84 81  Temp:      TempSrc:      Resp: Height:      Weight:      SpO2: 96% 92% 93% 92%   Eyes: PER, lids and conjunctivae normal ENMT: Mucous membranes are moist. Posterior pharynx clear of any exudate or lesions.Normal dentition.  Neck: normal, supple, no masses Respiratory: clear to auscultation bilaterally, no wheezing, no crackles. Normal respiratory effort. No accessory muscle use.  Cardiovascular: Regular rate and rhythm, no murmurs / rubs / gallops. No extremity edema. 2+ pedal pulses. No carotid bruits.  Abdomen: Non tender on exam but developed localized RUQ tenderness with engagement of abdominal muscles  no masses palpated. No hepatomegaly. Bowel sounds positive.  Musculoskeletal: no clubbing / cyanosis. No joint deformity upper and lower extremities. Good ROM, no contractures. Normal muscle tone.  Skin: no rashes, lesions, ulcers.  Neurologic: CN 2-12 grossly intact. Sensation intact, Strength 5/5 in all 4.  Psychiatric: flat affect, cooperative, good eye contact  Labs on Admission: I have personally reviewed following labs and imaging studies  Radiological Exams on Admission: No results found.  Assessment/Plan    Depression, suicidal ideation. Over the last few weeks patient has thought about taking her own life with pill  overdose. Despondent over lack of financial / social resources. -place in OBS - Medical bed / negative pressure room.  -Sitter in room -basic labs -bmet and cbc -Psych consult called, spoke with Inetta Fermo. -Social Work consult for [employer], help with getting PCP   History of tuberculosis, took 6 months of treatment in [place] last year ( 6 months). She is s/p bronchoscopy today to rule out reactivated TB (done for fevers, mild cough, weight loss).  -Pulmonary will leave a note on patient and follow up on bronch studies  ILD (interstitial lung disease) (HCC). Fibrosis on chest CTscan.   Hx of lupus, hospitalized last year in [place] with lupus pneumonitis. Treated with Cellcept and steroids but ran out of meds 2 months ago and didn't have means to establish care with PCP in [facility] for any refills. She is still taking Bactrim for PCP prophylaxis.   Arthalgias/ bilateral upper and lower extremity weakness/  ambulation difficulty.  -PT eval  RUQ pain, several week history. By history and exam this really seems musculoskeletal.  -Obtain abdominal u/s.  -normal LFTs 5/30 and 6/15    Essential hypertension. Untreated, patient ran out of medications. BP controlled today. -monitor BP for now.   Diabetes mellitus, type 2 (HCC). Ran out of medications weeks ago.  -A1c -CBGs ac and HS for now.      DVT prophylaxis:   SCDs today. Recommend Lovenox 24 hours after bronchoscopy Code Status:   Full code. ( Patient suicidal) Family Communication:     none.  Disposition Plan:  Will depend on clinical course. [facility] still suicidal after TB studies return.       Consults called:    Psychiatry Admission status:  Observation - Medical bed / telemetry  Admission -  Medical bed  Telemetry    Willette ClusterPaula Guenther NP [facility] Pager [phone]  If 7PM-7AM, please contact night-coverage [email or web address] Password [email or web address]  01/28/2016, 8:51 AM

## 2016-01-29 ENCOUNTER — Observation Stay (HOSPITAL_COMMUNITY): Payer: Medicaid - Out of State

## 2016-01-29 DIAGNOSIS — M329 Systemic lupus erythematosus, unspecified: Secondary | ICD-10-CM | POA: Diagnosis present

## 2016-01-29 DIAGNOSIS — E119 Type 2 diabetes mellitus without complications: Secondary | ICD-10-CM | POA: Diagnosis present

## 2016-01-29 DIAGNOSIS — I10 Essential (primary) hypertension: Secondary | ICD-10-CM | POA: Diagnosis present

## 2016-01-29 DIAGNOSIS — R45851 Suicidal ideations: Secondary | ICD-10-CM | POA: Diagnosis present

## 2016-01-29 DIAGNOSIS — F329 Major depressive disorder, single episode, unspecified: Secondary | ICD-10-CM

## 2016-01-29 DIAGNOSIS — E785 Hyperlipidemia, unspecified: Secondary | ICD-10-CM | POA: Diagnosis present

## 2016-01-29 DIAGNOSIS — R0689 Other abnormalities of breathing: Secondary | ICD-10-CM | POA: Diagnosis present

## 2016-01-29 DIAGNOSIS — Z8611 Personal history of tuberculosis: Secondary | ICD-10-CM | POA: Diagnosis present

## 2016-01-29 DIAGNOSIS — J8410 Pulmonary fibrosis, unspecified: Secondary | ICD-10-CM | POA: Diagnosis present

## 2016-01-29 DIAGNOSIS — Z833 Family history of diabetes mellitus: Secondary | ICD-10-CM | POA: Diagnosis not present

## 2016-01-29 DIAGNOSIS — K219 Gastro-esophageal reflux disease without esophagitis: Secondary | ICD-10-CM | POA: Diagnosis present

## 2016-01-29 LAB — ACID FAST SMEAR (AFB, MYCOBACTERIA): Acid Fast Smear: NEGATIVE

## 2016-01-29 LAB — GLUCOSE, CAPILLARY
GLUCOSE-CAPILLARY: 84 mg/dL (ref 65–99)
Glucose-Capillary: 102 mg/dL — ABNORMAL HIGH (ref 65–99)
Glucose-Capillary: 155 mg/dL — ABNORMAL HIGH (ref 65–99)
Glucose-Capillary: 122 mg/dL — ABNORMAL HIGH (ref 65–99)

## 2016-01-29 LAB — RESPIRATORY VIRUS PANEL
Adenovirus: NEGATIVE
INFLUENZA A: NEGATIVE
INFLUENZA B 1: NEGATIVE
Metapneumovirus: NEGATIVE
PARAINFLUENZA 1 A: NEGATIVE
PARAINFLUENZA 2 A: NEGATIVE
PARAINFLUENZA 3 A: NEGATIVE
RHINOVIRUS: NEGATIVE
Respiratory Syncytial Virus A: NEGATIVE
Respiratory Syncytial Virus B: NEGATIVE

## 2016-01-29 LAB — CBC
HCT: 32.9 % — ABNORMAL LOW (ref 36.0–46.0)
Hemoglobin: 10.8 g/dL — ABNORMAL LOW (ref 12.0–15.0)
MCH: 28.9 pg (ref 26.0–34.0)
MCHC: 32.8 g/dL (ref 30.0–36.0)
MCV: 88 fL (ref 78.0–100.0)
PLATELETS: 275 10*3/uL (ref 150–400)
RBC: 3.74 MIL/uL — ABNORMAL LOW (ref 3.87–5.11)
RDW: 13.6 % (ref 11.5–15.5)
WBC: 5.3 10*3/uL (ref 4.0–10.5)

## 2016-01-29 LAB — BASIC METABOLIC PANEL
Anion gap: 6 (ref 5–15)
BUN: 13 mg/dL (ref 6–20)
CALCIUM: 8.7 mg/dL — AB (ref 8.9–10.3)
CO2: 23 mmol/L (ref 22–32)
CREATININE: 0.64 mg/dL (ref 0.44–1.00)
Chloride: 112 mmol/L — ABNORMAL HIGH (ref 101–111)
GFR calc Af Amer: >60 mL/min (ref >60)
GFR calc non Af Amer: >60 mL/min (ref >60)
GLUCOSE: 93 mg/dL (ref 65–99)
Potassium: 3.8 mmol/L (ref 3.5–5.1)
Sodium: 141 mmol/L (ref 135–145)

## 2016-01-29 LAB — PNEUMOCYSTIS JIROVECI SMEAR BY DFA: PNEUMOCYSTIS JIROVECI AG: NEGATIVE

## 2016-01-29 LAB — ACID FAST SMEAR (AFB)

## 2016-01-29 LAB — HEMOGLOBIN A1C
HEMOGLOBIN A1C: 6.5 % — AB (ref 4.8–5.6)
Mean Plasma Glucose: 140 mg/dL

## 2016-01-29 MED ORDER — ENSURE ENLIVE PO LIQD
237.0000 mL | Freq: Every day | ORAL | Status: DC
  Administered 2016-01-29: 237 mL via ORAL

## 2016-01-29 MED ORDER — ENOXAPARIN SODIUM 30 MG/0.3ML ~~LOC~~ SOLN
30.0000 mg | SUBCUTANEOUS | Status: DC
  Administered 2016-01-29: 30 mg via SUBCUTANEOUS

## 2016-01-29 MED ORDER — ENOXAPARIN SODIUM 30 MG/0.3ML ~~LOC~~ SOLN: 30.0000 mg | SUBCUTANEOUS | Status: DC

## 2016-01-29 NOTE — Progress Notes (Signed)
PT Cancellation Note  Patient Details Name: Cindy GriceCam Pardon MRN: [id number] DOB: 12-Jun-1961   Cancelled Treatment:    Reason Eval/Treat Not Completed: Medical issues which prohibited therapy;Patient at procedure or test/unavailable.  Pt is now heading to [place] and will see if she is available afterward.   Ivar DrapeStout, Seymore Brodowski E 01/29/2016, 8:39 AM   Samul Dadauth Davetta Olliff, PT MS Acute Rehab Dept. Number: [facility] [id number] and [facility] [phone]

## 2016-01-29 NOTE — Progress Notes (Signed)
Initial Nutrition Assessment  DOCUMENTATION CODES:   Underweight  INTERVENTION:  Provide Ensure Enlive po once daily, each supplement provides 350 kcal and 20 grams of protein.  Encourage adequate PO intake.   NUTRITION DIAGNOSIS:   Increased nutrient needs related to acute illness as evidenced by estimated needs.  GOAL:   Patient will meet greater than or equal to 90% of their needs  MONITOR:   PO intake, Supplement acceptance, Weight trends, Labs, I & O's  REASON FOR ASSESSMENT:   Malnutrition Screening Tool    ASSESSMENT:   [age] Vietnamese with social issues, with recent Lupus pneumonitis flare and PTB treatment (2016) over in [place], admitted after outpt bronchoscopy as she expressed being suicidal and was depressed. She needs Psychiatric admission but [facility]. She is being R/O for PTB.   Meal completion has been 95%. Per MD note, pt was nauseated PTA and has lost 5 lbs the last 2-3 weeks. Per Epic weight records, pt with a 6.3% weight loss in 1 month. RD unable to obtain information on pt's usual intake PTA. RD to order Ensure to aid in caloric and protein needs. RD to continue to monitor.  Pt with no significant fat or muscle mass loss.   Labs and medications reviewed.   Diet Order:  Diet heart healthy/carb modified Room service appropriate?: Yes; Fluid consistency:: Thin  Skin:  Reviewed, no issues  Last BM:  6/21  Height:   Ht Readings from Last 1 Encounters:  01/28/16 4\' 11"  (1.499 m)    Weight:   Wt Readings from Last 1 Encounters:  01/28/16 89 lb (40.37 kg)    Ideal Body Weight:  44.5 kg  BMI:  Body mass index is 17.97 kg/(m^2).  Estimated Nutritional Needs:   Kcal:  1400-1600  Protein:  60-70 grams  Fluid:  >/= 1.5 L/day  EDUCATION NEEDS:   No education needs identified at this time  Roslyn SmilingStephanie Tacie Mccuistion, MS, RD, LDN Pager # [phone] After hours/ weekend pager # [phone]

## 2016-01-29 NOTE — Progress Notes (Signed)
[facility] Progress Note  Patient: Cindy GriceCam Wolbert ZOX:[id number]   PCP: No PCP Per Patient DOB: 07-28-1961   DOA: 01/28/2016   DOS: 01/29/2016   Date of Service: the patient was seen and examined on 01/29/2016  Subjective: Denies any acute complaint Nutrition: Tolerating oral diet  Brief hospital course: Pt. with PMH of HTN, DM, depression; admitted on 01/28/2016, with complaint of suicidal ideation, was found to have no suicidal ideation. Currently further plan is continue close monitoring.  Assessment and Plan: 1. Suicide ideation Appreciate psychiatric consultation. Psychiatry feels that the patient does not have any suicidal ideation at present. The patient is contracting for safety and the patient can safely be discharged home with outpatient follow-up. Suicide precaution has been taken off. We will continue to closely monitor.  2. History of tuberculosis. Concern about pulmonary tuberculosis. Bronchiolitis currently pending. We will continue to closely monitor. We discussed with pulmonary on 01/30/2016. Patient can likely be discharged home pending recommendation from pulmonary.  3. Social situation. Patient newly transferred [facility]. Case manager working on patient's case. We will continue to monitor for recommendation.  Pain management: When necessary Tylenol Activity: Consulted physical therapy Bowel regimen: last BM prior to arrival Diet: Regular diet DVT Prophylaxis: subcutaneous Heparin  Advance goals of care discussion: Full code  Family Communication: no family was present at bedside, at the time of interview.   Disposition:  Discharge to home. Expected discharge date: 01/30/2016, AFB and pulmonary recommendation as well as case management consult  Consultants: Pulmonary, psychiatry Procedures: , None  Antibiotics: Anti-infectives    None        Intake/Output Summary (Last 24 hours) at 01/29/16 1918 Last data filed at 01/29/16 1900  Gross  per 24 hour  Intake 1541.25 ml  Output   2600 ml  Net -1058.75 ml   Filed Weights   01/28/16 0647 01/28/16 1300  Weight: 40.37 kg (89 lb) 40.37 kg (89 lb)    Objective: Physical Exam: Filed Vitals:   01/28/16 1718 01/29/16 0500 01/29/16 1013 01/29/16 1719  BP: 112/55 138/54 117/64 142/64  Pulse: 84 61 74 74  Temp: 98.1 F (36.7 C) 98.3 F (36.8 C) 98 F (36.7 C) 98.3 F (36.8 C)  TempSrc: Oral Oral Oral Oral  Resp: 18 16 20 18   Height:      Weight:      SpO2: 96% 99% 98% 98%    General: Alert, Awake and Oriented to Time, Place and Person. Appear in no distress Eyes: PERRL, Conjunctiva normal ENT: Oral Mucosa clear moist. Neck: no JVD, no Abnormal Mass Or lumps Cardiovascular: S1 and S2 Present, no Murmur, Respiratory: Bilateral Air entry equal and Decreased, basal Crackles, bilateral wheezes Abdomen: Bowel Sound present, Soft and no tenderness Skin: no redness, no Rash  Extremities: no Pedal edema, no calf tenderness Neurologic: Grossly no focal neuro deficit. Bilaterally Equal motor strength  Data Reviewed: CBC:  Recent Labs Lab 01/29/16 0725  WBC 5.3  HGB 10.8*  HCT 32.9*  MCV 88.0  PLT 275   Basic Metabolic Panel:  Recent Labs Lab 01/29/16 0725  NA 141  K 3.8  CL 112*  CO2 23  GLUCOSE 93  BUN 13  CREATININE 0.64  CALCIUM 8.7*    Liver Function Tests: No results for input(s): AST, ALT, ALKPHOS, BILITOT, PROT, ALBUMIN in the last 168 hours. No results for input(s): LIPASE, AMYLASE in the last 168 hours. No results for input(s): AMMONIA in the last 168 hours. Coagulation Profile: No results  for input(s): INR, PROTIME in the last 168 hours. Cardiac Enzymes: No results for input(s): CKTOTAL, CKMB, CKMBINDEX, TROPONINI in the last 168 hours. BNP (last 3 results) No results for input(s): PROBNP in the last 8760 hours.  CBG:  Recent Labs Lab 01/28/16 0649 01/28/16 1629 01/29/16 0730 01/29/16 1150 01/29/16 1721  GLUCAP 82 191* 84 102*  155*    Studies: Koreas Abdomen Complete  01/29/2016  CLINICAL DATA:  55 year old female with right upper quadrant abdominal pain for several weeks. Initial encounter. EXAM: ABDOMEN ULTRASOUND COMPLETE COMPARISON:  High-resolution chest CT 01/20/2016 FINDINGS: Gallbladder: No gallstones or wall thickening visualized. No sonographic Murphy sign noted by sonographer. Common bile duct: Diameter: 4 mm, normal Liver: Echogenicity of the liver parenchyma appears mildly to moderately increased (image 42). No intrahepatic biliary ductal dilatation or discrete liver lesion. IVC: No abnormality visualized. Pancreas: Visualized portion unremarkable. Spleen: Nonenlarged, 6.8 cm in length. Punctate calcified granulomas re - demonstrated. Right Kidney: Length: 10.0 cm. Echogenicity within normal limits. No mass or hydronephrosis visualized. Left Kidney: Length: 11.0 cm. Not as well visualized as the right kidney, but no hydronephrosis or left renal mass is evident. Abdominal aorta: No aneurysm visualized. Other findings: None. IMPRESSION: Normal gallbladder. Negative sonographic appearance of the abdomen aside from possible hepatic steatosis. Electronically Signed   By: Odessa FlemingH  Hall M.D.   On: 01/29/2016 08:40     Scheduled Meds: . enoxaparin (LOVENOX) injection  30 mg Subcutaneous Q24H  . feeding supplement (ENSURE ENLIVE)  237 mL Oral Q1500   Continuous Infusions: . sodium chloride     PRN Meds: acetaminophen **OR** acetaminophen, bisacodyl, HYDROcodone-acetaminophen, naLOXone (NARCAN)  injection, ondansetron **OR** ondansetron (ZOFRAN) IV, polyethylene glycol, traZODone  Time spent: 30 minutes  Author: Lynden OxfordPranav Patel, MD [facility] Pager: [phone] 01/29/2016 7:18 PM  If 7PM-7AM, please contact night-coverage at [email or web address], password [email or web address]

## 2016-01-30 LAB — GLUCOSE, CAPILLARY
Glucose-Capillary: 94 mg/dL (ref 65–99)
Glucose-Capillary: 79 mg/dL (ref 65–99)

## 2016-01-30 LAB — CULTURE, BAL-QUANTITATIVE W GRAM STAIN: Special Requests: NORMAL

## 2016-01-30 LAB — LEGIONELLA PNEUMOPHILA SEROGP 1 UR AG: L. pneumophila Serogp 1 Ur Ag: NEGATIVE

## 2016-01-30 LAB — CULTURE, BAL-QUANTITATIVE: CULTURE: NORMAL — AB

## 2016-01-30 MED ORDER — BUDESONIDE-FORMOTEROL FUMARATE 80-4.5 MCG/ACT IN AERO: 2.0000 | INHALATION_SPRAY | Freq: Two times a day (BID) | RESPIRATORY_TRACT | Status: AC

## 2016-01-30 MED ORDER — ENSURE ENLIVE PO LIQD: 237.0000 mL | Freq: Every day | ORAL | Status: DC

## 2016-01-30 NOTE — Care Management (Signed)
CM received call concerning patient leaving without her home meds held in the [facility]. Spoke patient's sister [name] [phone] who states, she will come to collect the meds this evening. CM updated 6E staff. No further CM needs identified.

## 2016-01-30 NOTE — Progress Notes (Signed)
CSW provided pt with taxi voucher transportation home. 

## 2016-01-30 NOTE — Care Management (Signed)
CM consulted regarding medication assistance and follow up care. CM met with patient at bedside with [employer] services. Patient reports not being able to afford medications, she is has relocated to [place] from [facility]. Discussed MATCH program and guidelines with patient.  Carter f/u at [facility] to establish care on Monday 6/26 at 7, and  Primary care f/u arranged at the [facility] Tuesday 6/27 at 11:30am. Patient verbalized understanding via [employer] interpreter. Updated Patient's sister Zelpha Messing regarding appts, verbalized understanding. No further CM needs identified.

## 2016-01-31 LAB — HSV CULTURE AND TYPING

## 2016-01-31 NOTE — Discharge Summary (Signed)
[facility] Discharge Summary   Patient: Cindy Wheeler ZOX:[id number]   PCP: No PCP Per Patient DOB: 1961-07-19   Date of admission: 01/28/2016   Date of discharge: 01/30/2016     Discharge Diagnoses:  Principal Problem:   Suicide ideation Active Problems:   ILD (interstitial lung disease) (HCC)   Lupus (HCC)   RUQ pain   History of tuberculosis   Essential hypertension   Diabetes mellitus, type 2 (HCC)   Depression   Concern about pulmonary TB without diagnosis  Admitted From: Home Disposition:  Home  Recommendations for Outpatient Follow-up:  1. Establish care with [facility] in follow-up as recommended. 2. Please follow-up with pulmonary on scheduled appointment.  3. Please establish care with outpatient psychiatry unit as per psychiatry recommendation.  Follow-up Information    Follow up with [facility] On 02/02/2016.   Why:  Appointment scheduled for Tuesday 6/27 at 11:30am Dr. Carleene Overlie information:   [address] [place] [address] [phone]      Follow up with Louann Sjogren, MD. Go on 02/11/2016.   Specialty:  Pulmonary Disease   Contact information:   [address] [place] [address] [phone]       Go to [facility].   Specialty:  Behavioral Health   Why:  [facility] at 7:30am Monday 6/26    Contact information:   [address] [place] [address] [phone]      Diet recommendation: Regular diet  Activity: The patient is advised to gradually reintroduce usual activities.  Discharge Condition: good  Code Status: Full code  History of present illness: As per the H and P dictated on admission, "Cindy Wheeler is a 55 y.o. female with a Past Medical History of diabetes, GERD, HLD, lupus, TB who presents for admission after having a bronchoscopy performed for evaluation of active TB. Patient endorses suicidal  ideation during her treatment requiring admission. Behavioral Health refusing to take patient due to active workup for TB. Note patient has a history of treatment of TB though it is thought exactly clear she received full treatment or not. Patient with significant psychiatric history and complex social situation. "  Hospital Course:  Summary of her active problems in the hospital is as following. 1. Suicide ideation Appreciate psychiatric consultation. Psychiatry feels that the patient does not have any suicidal ideation at present. The patient is contracting for safety and the patient can safely be discharged home with outpatient follow-up. Suicide precaution has been taken off. We will continue to closely monitor.  2. History of tuberculosis. Concern about pulmonary tuberculosis. BL AFB smear negative as well as no other evidence of acute infection on preliminary report. I discussed with on-call pulmonologist who recommended as the patient can safely be taken off of respiratory isolation and also can be discharged home. They also recommended to use inhalers on discharge I will discharge the patient on Symbicort.  3. Social situation. Patient newly transferred to [facility]. Case manager provided outpatient PCPs appointment who will be able to arrange and shortness for the patient.  All other chronic medical condition were stable during the hospitalization.  Patient was ambulatory without any assistance. On the day of the discharge the patient's vitals were stable, and no other acute medical condition were reported by patient. the patient was felt safe to be discharge at home with family.  Procedures and  Results:  Bronchoscopy prior to admission   Consultations:  Pulmonary  Psychiatric  DISCHARGE MEDICATION: Discharge Medication List as of 01/30/2016 12:28 PM    START taking these medications   Details  budesonide-formoterol (SYMBICORT) 80-4.5 MCG/ACT inhaler Inhale 2  puffs into the lungs 2 (two) times daily., Starting 01/30/2016, Until Discontinued, Normal    feeding supplement, ENSURE ENLIVE, (ENSURE ENLIVE) LIQD Take 237 mLs by mouth daily at 3 pm., Starting 01/30/2016, Until Discontinued, Normal      CONTINUE these medications which have NOT CHANGED   Details  amoxicillin (AMOXIL) 500 MG capsule Take 1 capsule (500 mg total) by mouth 3 (three) times daily., Starting 01/21/2016, Until Discontinued, Normal    omeprazole (PRILOSEC) 20 MG capsule Take 1 capsule (20 mg total) by mouth daily., Starting 01/05/2016, Until Discontinued, Print    ranitidine (ZANTAC) 150 MG tablet Take 150 mg by mouth 2 (two) times daily., Until Discontinued, Historical Med      STOP taking these medications     HYDROcodone-acetaminophen (NORCO/VICODIN) 5-325 MG tablet        No Known Allergies Discharge Instructions    Diet general    Complete by:  As directed      Discharge instructions    Complete by:  As directed   It is important that you read following instructions as well as go over your medication list with RN to help you understand your care after this hospitalization.  Discharge Instructions: Please follow-up with PCP in one week  Please request your primary care physician to go over all Hospital Tests and Procedure/Radiological results at the follow up,  Please get all Hospital records sent to your PCP by signing hospital release before you go home.   Do not take more than prescribed Pain, Sleep and Anxiety Medications. You were cared for by a hospitalist during your hospital stay. If you have any questions about your discharge medications or the care you received while you were in the hospital after you are discharged, you can call the unit and ask to speak with the hospitalist on call if the hospitalist that took care of you is not available.  Once you are discharged, your primary care physician will handle any further medical issues. Please note that NO  REFILLS for any discharge medications will be authorized once you are discharged, as it is imperative that you return to your primary care physician (or establish a relationship with a primary care physician if you do not have one) for your aftercare needs so that they can reassess your need for medications and monitor your lab values. You Must read complete instructions/literature along with all the possible adverse reactions/side effects for all the Medicines you take and that have been prescribed to you. Take any new Medicines after you have completely understood and accept all the possible adverse reactions/side effects. Wear Seat belts while driving. If you have smoked or chewed Tobacco in the last 2 yrs please stop smoking and/or stop any Recreational drug use.     Increase activity slowly    Complete by:  As directed           Discharge Exam: Filed Weights   01/28/16 0647 01/28/16 1300 01/29/16 2103  Weight: 40.37 kg (89 lb) 40.37 kg (89 lb) 40.824 kg (90 lb)   Filed Vitals:   01/30/16 0612 01/30/16 0909  BP: 139/66 132/63  Pulse: 66 70  Temp: 98.2 F (36.8 C) 98.6 F (37 C)  Resp: 20 20  General: Appear in no distress, no Rash; Oral Mucosa moist. Cardiovascular: S1 and S2 Present, no Murmur, no JVD Respiratory: Bilateral Air entry present and no Crackles, bilateral wheezes Abdomen: Bowel Sound present, Soft and no tenderness Extremities: no Pedal edema, no calf tenderness Neurology: Grossly no focal neuro deficit.  The results of significant diagnostics from this hospitalization (including imaging, microbiology, ancillary and laboratory) are listed below for reference.    Significant Diagnostic Studies: Koreas Abdomen Complete  01/29/2016  CLINICAL DATA:  55 year old female with right upper quadrant abdominal pain for several weeks. Initial encounter. EXAM: ABDOMEN ULTRASOUND COMPLETE COMPARISON:  High-resolution chest CT 01/20/2016 FINDINGS: Gallbladder: No gallstones or wall  thickening visualized. No sonographic Murphy sign noted by sonographer. Common bile duct: Diameter: 4 mm, normal Liver: Echogenicity of the liver parenchyma appears mildly to moderately increased (image 42). No intrahepatic biliary ductal dilatation or discrete liver lesion. IVC: No abnormality visualized. Pancreas: Visualized portion unremarkable. Spleen: Nonenlarged, 6.8 cm in length. Punctate calcified granulomas re - demonstrated. Right Kidney: Length: 10.0 cm. Echogenicity within normal limits. No mass or hydronephrosis visualized. Left Kidney: Length: 11.0 cm. Not as well visualized as the right kidney, but no hydronephrosis or left renal mass is evident. Abdominal aorta: No aneurysm visualized. Other findings: None. IMPRESSION: Normal gallbladder. Negative sonographic appearance of the abdomen aside from possible hepatic steatosis. Electronically Signed   By: Odessa FlemingH  Hall M.D.   On: 01/29/2016 08:40   Ct Chest High Resolution  01/20/2016  CLINICAL DATA:  New onset chest pain with inspiration, shortness of breath with exertion since May. EXAM: CT CHEST WITHOUT CONTRAST TECHNIQUE: Multidetector CT imaging of the chest was performed following the standard protocol without intravenous contrast. High resolution imaging of the lungs, as well as inspiratory and expiratory imaging, was performed. COMPARISON:  Chest radiograph 01/05/2016. FINDINGS: Mediastinum/Lymph Nodes: Low left internal jugular lymph node measures 8 mm. Mediastinal lymph nodes measure up to 9 mm in the low right paratracheal station. Calcified subcarinal lymph nodes. Hilar regions are difficult to definitively evaluate without IV contrast. There are hazy axillary lymph nodes bilaterally, none of which are enlarged by CT size criteria. Coronary artery calcification. Heart is mildly enlarged. No pericardial effusion. Pre pericardiac lymph nodes are sub cm in short axis size. Lungs/Pleura: There is somewhat basilar predominant mild subpleural  reticulation with traction bronchiolectasis. No definite honeycombing. Image quality is somewhat degraded by respiratory motion. No air trapping. No pleural fluid. Airway is unremarkable. Upper abdomen: Visualized portions of the liver, left adrenal gland and left kidney are unremarkable. Numerous calcifications in the spleen. Visualized portions of the pancreas, stomach and bowel are grossly unremarkable. No upper abdominal adenopathy. Musculoskeletal: No worrisome lytic or sclerotic lesions. IMPRESSION: 1. Pulmonary parenchymal pattern of mild fibrosis may be due to nonspecific interstitial pneumonitis or usual interstitial pneumonitis. 2. Numerous subcentimeter hazy mediastinal and axillary lymph nodes. While mediastinal adenopathy can be seen in the setting of interstitial lung disease, axillary adenopathy is unexpected. A lymphoproliferative disorder cannot be excluded. Sarcoid is considered less likely given absence of perilymphatic nodularity in the lungs. 3. Coronary artery calcification. Electronically Signed   By: Leanna BattlesMelinda  Blietz M.D.   On: 01/20/2016 10:17   Dg Abd Acute W/chest  01/05/2016  CLINICAL DATA:  Severe pain in the diaphragm radiating into the chest. Shortness of breath. EXAM: DG ABDOMEN ACUTE W/ 1V CHEST COMPARISON:  None. FINDINGS: Old fractures involving the left fourth and fifth ribs. No significant airspace disease or pulmonary edema in the lungs. Heart  and mediastinum are within normal limits. Atherosclerotic calcifications at the aortic arch. Nonobstructive bowel gas pattern. Gas and stool in the colon. No evidence for free air. No large abdominal calcifications. Bone structures appear to be intact. IMPRESSION: No acute abnormalities. Electronically Signed   By: Richarda Overlie M.D.   On: 01/05/2016 13:04    Microbiology: Recent Results (from the past 240 hour(s))  Culture, bal-quantitative     Status: Abnormal   Collection Time: 01/28/16  7:55 AM  Result Value Ref Range Status    Specimen Description BRONCHIAL ALVEOLAR LAVAGE  Final   Special Requests Normal  Final   Gram Stain   Final    MODERATE WBC PRESENT,BOTH PMN AND MONONUCLEAR NO ORGANISMS SEEN    Culture (A)  Final    8,000 COLONIES/mL Consistent with normal respiratory flora.   Report Status 01/30/2016 FINAL  Final  Pneumocystis smear by DFA     Status: None   Collection Time: 01/28/16  7:55 AM  Result Value Ref Range Status   Specimen Source-PJSRC BRONCHIAL ALVEOLAR LAVAGE  Final   Pneumocystis jiroveci Ag NEGATIVE  Final    Comment: Performed at [facility]  Acid Fast Smear (AFB)     Status: None   Collection Time: 01/28/16  7:55 AM  Result Value Ref Range Status   AFB Specimen Processing Concentration  Final   Acid Fast Smear Negative  Final    Comment: (NOTE) Performed At: [facility] [address] [place], [place] [id number] Mila Homer MD WU:[id number]    Source (AFB) BRONCHIAL ALVEOLAR LAVAGE  Final  Respiratory virus panel     Status: None   Collection Time: 01/28/16  7:55 AM  Result Value Ref Range Status   Source - RVPAN BRONCHIAL ALVEOLAR LAVAGE  Final   Respiratory Syncytial Virus A Negative Negative Final   Respiratory Syncytial Virus B Negative Negative Final   Influenza A Negative Negative Final   Influenza B Negative Negative Final   Parainfluenza 1 Negative Negative Final   Parainfluenza 2 Negative Negative Final   Parainfluenza 3 Negative Negative Final   Metapneumovirus Negative Negative Final   Rhinovirus Negative Negative Final   Adenovirus Negative Negative Final    Comment: (NOTE) Performed At: [facility] [address] [place], [place] [id number] Mila Homer MD VH:[id number]   Hsv Culture And Typing     Status: None   Collection Time: 01/28/16  7:55 AM  Result Value Ref Range Status   HSV Culture/Type Comment  Final    Comment: (NOTE) Negative No Herpes simplex virus isolated. Performed At: [facility] [address] [place], [place] [id number] Mila Homer MD UV:[id number]    Source of Sample BRONCHIAL ALVEOLAR LAVAGE  Final     Labs: CBC:  Recent Labs Lab 01/29/16 0725  WBC 5.3  HGB 10.8*  HCT 32.9*  MCV 88.0  PLT 275   Basic Metabolic Panel:  Recent Labs Lab 01/29/16 0725  NA 141  K 3.8  CL 112*  CO2 23  GLUCOSE 93  BUN 13  CREATININE 0.64  CALCIUM 8.7*   CBG:  Recent Labs Lab 01/29/16 1150 01/29/16 1721 01/29/16 2100 01/30/16 0748 01/30/16 1203  GLUCAP 102* 155* 122* 94 79   Time spent: 30 minutes  Signed:  Rhyse Loux  [facility] 01/30/2016 , 10:20 PM

## 2016-02-01 ENCOUNTER — Ambulatory Visit: Payer: Medicaid - Out of State | Attending: Internal Medicine

## 2016-02-01 ENCOUNTER — Ambulatory Visit: Payer: Medicaid - Out of State

## 2016-02-02 ENCOUNTER — Ambulatory Visit: Payer: Self-pay | Attending: Family Medicine | Admitting: Family Medicine

## 2016-02-02 ENCOUNTER — Encounter: Payer: Self-pay | Admitting: Family Medicine

## 2016-02-02 VITALS — BP 127/83 | HR 98 | Temp 98.5°F | Resp 18 | Ht <= 58 in | Wt 87.2 lb

## 2016-02-02 DIAGNOSIS — R1011 Right upper quadrant pain: Secondary | ICD-10-CM | POA: Insufficient documentation

## 2016-02-02 DIAGNOSIS — Z8611 Personal history of tuberculosis: Secondary | ICD-10-CM

## 2016-02-02 DIAGNOSIS — E119 Type 2 diabetes mellitus without complications: Secondary | ICD-10-CM | POA: Insufficient documentation

## 2016-02-02 DIAGNOSIS — Z9889 Other specified postprocedural states: Secondary | ICD-10-CM | POA: Insufficient documentation

## 2016-02-02 DIAGNOSIS — M329 Systemic lupus erythematosus, unspecified: Secondary | ICD-10-CM | POA: Insufficient documentation

## 2016-02-02 DIAGNOSIS — Z79899 Other long term (current) drug therapy: Secondary | ICD-10-CM | POA: Insufficient documentation

## 2016-02-02 DIAGNOSIS — J849 Interstitial pulmonary disease, unspecified: Secondary | ICD-10-CM | POA: Insufficient documentation

## 2016-02-02 LAB — GLUCOSE, POCT (MANUAL RESULT ENTRY): POC Glucose: 105 mg/dl — AB (ref 70–99)

## 2016-02-02 MED ORDER — GLUCOSE BLOOD VI STRP: ORAL_STRIP | Status: DC

## 2016-02-02 MED ORDER — OMEPRAZOLE 20 MG PO CPDR: 20.0000 mg | DELAYED_RELEASE_CAPSULE | Freq: Every day | ORAL | Status: DC

## 2016-02-02 MED ORDER — TRUE METRIX METER DEVI: 1.0000 | Freq: Every day | Status: DC

## 2016-02-02 MED ORDER — SUCRALFATE 1 G PO TABS: 1.0000 g | ORAL_TABLET | Freq: Three times a day (TID) | ORAL | Status: DC

## 2016-02-02 MED ORDER — TRUEPLUS LANCETS 28G MISC: 1.0000 | Freq: Every day | Status: DC

## 2016-02-02 MED FILL — SUCRALFATE 1 GM TABLET: 1 | 30 days supply | Qty: 120 | Fill #0

## 2016-02-02 MED FILL — ?OMEPRAZOLE DR 20 MG CAPSUL: 20 | 30 days supply | Qty: 30 | Fill #0

## 2016-02-02 MED FILL — TRUEplus LANCETS 28G MISC: 100 days supply | Qty: 100 | Fill #0

## 2016-02-02 MED FILL — TRUE METRIX TEST STRIP: 30 days supply | Qty: 50 | Fill #0

## 2016-02-02 MED FILL — !TRUE METRIX BLOOD GLUCOSE: 1 days supply | Qty: 1 | Fill #0

## 2016-02-02 MED FILL — SYMBICORT 80-4.5 MCG INH: 80-4.5 | 30 days supply | Qty: 10 | Fill #0

## 2016-02-02 NOTE — Progress Notes (Signed)
Has not taken any medication since she got out of the hospital Does not take anything for diabetes

## 2016-02-02 NOTE — Patient Instructions (Signed)
B?nh ti?u ???ng tp 2, Ng??i l?n (Type 2 Diabetes Mellitus, Adult) B?nh ti?u ???ng tp 2, th??ng g?i ??n gi?n l ti?u ???ng tp 2, l m?t b?nh ko di (m?n tnh). Trong ti?u ???ng tp 2, tuy?n t?y khng s?n xu?t ?? insulin (hocmon), cc t? bo t ?p ?ng v?i insulin lm cho ( khng insulin), ho?c c? hai. Thng th??ng, insulin v?n chuy?n ???ng t? th?c ?n vo cc t? bo ? m. Cc t? bo ? m s? d?ng ???ng ?? s?n sinh ra n?ng l??ng. Thi?u h?t insulin ho?c khng ?p ?ng bnh th??ng v?i insulin gy ra l??ng ???ng d? th?a tch t? trong mu thay v ?i vo cc t? bo ? m. K?t qu? l, lm cho l??ng ???ng trong mu cao (t?ng ???ng huy?t). ?nh h??ng c?a hm l??ng ???ng (glucose) cao c th? gy ra nhi?u bi?n ch?ng.  B?nh ti?u ???ng tp 2 tr??c ?y cn ???c g?i l b?nh ti?u ???ng kh?i pht ? ng??i l?n, nh?ng n c th? x?y ra ? b?t c? l?a tu?i no.  CC Y?U T? NGUY C?  M?t ng??i d? b? b?nh ti?u ???ng tp 2 n?u c ai ? trong gia ?nh b? b?nh ny, ??ng th?i c m?t ho?c nhi?u y?u t? nguy c? chnh sau ?y:  T?ng cn ho?c th?a cn ho?c bo ph.  L?i s?ng t ho?t ??ng.  Ti?n s? lin t?c ?n th?c ?n nhi?u n?ng l??ng. Duy tr cn n?ng bnh th??ng v ho?t ??ng thn th? th??ng xuyn c th? lm gi?m nguy c? pht tri?n b?nh ti?u ???ng tp 2. TRI?U CH?NG  Ban ??u, m?t ng??i b? b?nh ti?u ???ng tp 2 c th? khng c cc tri?u ch?ng. Cc tri?u ch?ng c?a b?nh ti?u ???ng tp 2 xu?t hi?n t? t?. Cc tri?u ch?ng bao g?m:  Kht n??c nhi?u (ch?ng kht nhi?u).  Ti?u ti?n nhi?u (?a ni?u).  ?i ti?u nhi?u vo ban ?m (ti?u ?m).  Thay ??i cn n?ng m?t cch ??t ng?t ho?c khng r nguyn nhn.  Th??ng xuyn b? nhi?m trng ti pht.  M?t m?i (m?t)  Y?u.  Thay ??i th? l?c, ch?ng h?n nh? nhn m?.  Mi tri cy trong h?i th? c?a qu v?.  ?au b?ng.  Bu?n nn ho?c nn m?a.  V?t c?t ho?c v?t b?m tm lu lnh.  ?au bu?t ho?c t ? bn tay ho?c bn chn.  V?t th??ng h? trn da (lot). CH?N ?ON B?nh ti?u ???ng tp 2 th??ng  khng ???c ch?n ?on cho ??n khi xu?t hi?n cc bi?n ch?ng c?a b?nh ti?u ???ng. B?nh ti?u ???ng tp 2 ???c ch?n ?on khi xu?t hi?n cc tri?u ch?ng c?ng nh? bi?n ch?ng v khi l??ng ???ng huy?t t?ng. L??ng ???ng huy?t c th? ???c ki?m tra b?ng m?t ho?c nhi?u xt nghi?m mu sau ?y:  Xt nghi?m ???ng huy?t lc ?i. Qu v? s? khng ???c php ?n trong t nh?t l 8 ti?ng tr??c khi l?y m?u mu.  Xt nghi?m ???ng huy?t ng?u nhin. ???ng huy?t ???c xt nghi?m b?t k? lc no trong ngy, b?t k? qu v? ?n lc no.  Xt nghi?m ???ng huy?t A1c hemoglobin. Xt nghi?m A1c hemoglobin cung c?p thng tin v? vi?c ki?m sot ???ng huy?t trong 3 thng tr??c ?.  Xt nghi?m dung n?p glucose theo ???ng u?ng (OGTT). ???ng huy?t c?a qu v? ???c ?o sau khi qu v? ch?a ?n (nh?n ?n) trong 2 gi? v sau ? l sau khi qu v? u?ng ?? u?ng c ch?a glucose. ?  I?U TR?   Qu v? c th? c?n dng insulin ho?c thu?c tr? ti?u ???ng hng ngy ?? gi? cho l??ng ???ng huy?t trong ph?m vi mong mu?n.  N?u qu v? dng insulin, qu v? c th? c?n ?i?u ch?nh li?u thu?c ty thu?c vo l??ng carbohydrate m qu v? ?n trong m?i b?a ?n chnh ho?c b?a ?n nh?.  Thay ??i l?i s?ng ???c khuy?n ngh? l m?t ph?n trong bi?n php ?i?u tr? c?a qu v?. Nh?ng thay ??i ny c th? bao g?m:  Tun theo m?t ch? ?? ?n c nhn ha do m?t chuyn gia dinh d??ng ??a ra.  T?p th? d?c hng ngy. Chuyn gia ch?m sc s?c kh?e s? ??t cc m?c tiu ?i?u tr? c nhn ha cho qu v? d?a theo tu?i, cc lo?i thu?c c?a qu v?, th?i gian qu v? ? b? ti?u t??ng, v b?t k? tnh tr?ng b?nh l no khc m qu v? c. Thng th??ng, m?c tiu ?i?u tr? l duy tr n?ng ?? glucose trong mu:  Tr??c khi ?n (tr??c b?a ?n): 80-130 mg/dL.  Sau khi ?n (sau b?a ?n): d??i 180 mg/dL.  A1c: th?p h?n 6,5-7%. H??NG D?N CH?M SC T?I NH   L??ng A1c hemoglobin c?a qu v? ???c ki?m tra hai l?n m?i n?m.  Th?c hi?n vi?c theo di ???ng huy?t hng ngy theo ch? d?n c?a chuyn gia ch?m sc s?c kh?e.  Theo  di [place] trong n??c ti?u khi qu v? b? b?nh v theo ch? d?n c?a chuyn gia ch?m sc s?c kh?e.  S? d?ng thu?c tr? ti?u ???ng ho?c insulin theo ch? d?n c?a chuyn gia ch?m sc s?c kh?e ?? duy tr l??ng ???ng huy?t trong ph?m vi mong mu?n.  Khng bao gi? ?? h?t thu?c tr? ti?u ???ng ho?c insulin. Thu?c c?n ph?i dng hng ngy.  N?u qu v? ?ang dng insulin, qu v? c th? ?i?u ch?nh l??ng insulin d?a vo l??ng carbohydrates qu v? ?n. Carbohydrate c th? lm t?ng l??ng ???ng huy?t nh?ng c?n ph?i bao g?m trong ch? ?? ?n u?ng c?a qu v?. Carbohydrate cung c?p vitamin, khong ch?t v ch?t x?, l m?t ph?n thi?t y?u c?a ch? ?? ?n u?ng c l?i cho s?c kh?e. Carbohydrate ???c tm th?y trong tri cy, rau, ng? c?c, cc s?n ph?m t? s?a, cc lo?i ??u v cc lo?i th?c ph?m c b? sung thm ???ng.  ?n th?c ?n c l?i cho s?c kh?e. Qu v? c?n h?n g?p m?t chuyn gia dinh d??ng c ??ng k hnh ngh? ?? gip qu v? ??a ra m?t k? ho?ch ?n u?ng ph h?p.  Gi?m cn n?u qu v? th?a cn.  Mang theo th? c?nh bo y t? ho?c ?eo ?? trang s?c c c?nh bo y t?.  Mang theo ?? ?n nh? ch?a 15 gam carbohydrate m?i lc ?? ?i?u tr? h? ???ng huy?t (h? ???ng huy?t). M?t s? v d? v? ?? ?n nh? ch?a 15 gam carbohydrate bao g?m:  Vin glucose, 3 ho?c 4.  Gel glucose, ?ng 15 gam.  Nho kh, 2 mu?ng (24 gam).  Th?ch hnh h?t ??u, 6.  Bnh quy hnh con gi?ng, 8.  N??c u?ng c ga thng th??ng, 4 aox? (120 ml)  K?o chp chp, 9.  Nh?n bi?t h? ???ng huy?t. H? ???ng huy?t x?y ra khi l??ng ???ng huy?t t? 70 mg/dL tr? xu?ng. Nguy c? h? ???ng huy?t gia t?ng khi nh?n ?n ho?c b? b?a, trong v sau khi t?p th? d?c c??ng ?? cao v trong khi   ng?. Cc tri?u ch?ng h? ???ng huy?t c th? bao g?m:  Run ho?c l?c.  Gi?m kh? n?ng t?p trung.  ?? m? hi.  Nh?p tim t?ng.  ?au ??u.  Kh mi?ng.  ?i.  D? b? kch thch.  Lo u.  Ng? khng yn.  Thay ??i l?i ni ho?c s? ph?i h?p.  B? l l?n.  ?i?u tr? h? ???ng huy?t k?p th?i. N?u qu v?  t?nh to v c th? nu?t m?t cch an ton, hy theo quy t?c 15:15:  Dng 15-20 gam glucose ho?c carbohydrate c tc d?ng nhanh. L?a ch?n tc ??ng nhanh bao g?m gel glucose, vin glucose ho?c 4 aox? (120 ml) n??c p tri cy, soda bnh th??ng ho?c s?a t bo.  Ki?m tra l??ng ???ng huy?t c?a qu v? 15 pht sau khi u?ng glucose.  Dng t? 15-20 gam glucose tr? ln n?u l??ng ???ng huy?t ???c ?o l?i v?n ? m?c 70 mg/dL tr? xu?ng.  ?n theo b?a ?n bnh th??ng ho?c ?? ?n nh? trong vng 1 ti?ng sau khi l??ng ???ng huy?t tr? l?i bnh th??ng.  Hy c?nh gic v?i c?m gic r?t kht v ?i ti?u ti?n nhi?u l?n h?n bnh th??ng v ?y l nh?ng d?u hi?u s?m c?a t?ng ???ng huy?t. Vi?c pht hi?n t?ng ???ng huy?t s?m cho php ?i?u tr? k?p th?i. ?i?u tr? t?ng ???ng huy?t theo ch? d?n c?a chuyn gia ch?m sc s?c kh?e.  M?i tu?n tham gia vo t nh?t 150 pht ho?t ??ng thn th? v?i c??ng ?? trung bnh, phn b? trong t nh?t 3 ngy trong tu?n ho?c theo ch? d?n c?a chuyn gia ch?m sc s?c kh?e. Ngoi ra, qu v? nn tham gia vo bi t?p c s?c c?n t nh?t 2 l?n m?t tu?n ho?c theo ch? d?n c?a chuyn gia ch?m sc s?c kh?e. C? g?ng dnh khng qu 90 pht m?i l?n khng ho?t ??ng.  ?i?u ch?nh thu?c v l??ng th?c ?n khi c?n n?u qu v? b?t ??u m?t bi t?p ho?c m?t mn th? thao m?i.  Lm theo k? ho?ch trong ngy b? b?nh c?a qu v? b?t c? lc no m qu v? khng th? ?n ho?c u?ng nh? bnh th??ng.  Khng s? d?ng cc s?n ph?m thu?c l bao g?m thu?c l ht, thu?c l d?ng nhai ho?c thu?c l ?i?n t?. N?u qu v? c?n gip ?? ?? cai thu?c, hy h?i chuyn gia ch?m sc s?c kh?e.  Gi?i h?n l??ng r??u qu v? u?ng khng qu 1 ly m?i ngy v?i ph? n? khng mang thai v 2 ly m?i ngy v?i nam gi?i. Qu v? ch? nn u?ng r??u khi ?n. Ni chuy?n v?i chuyn gia ch?m sc s?c kh?e xem u?ng r??u c an ton cho qu v? hay khng. Cho chuyn gia ch?m sc s?c kh?e bi?t n?u qu v? u?ng r??u vi l?n m?i tu?n.  Tun th? m?i cu?c h?n khm l?i theo ch? d?n c?a chuyn  gia ch?m sc s?c kh?e. ?i?u ny l quan tr?ng.  S?p x?p bu?i khm m?t ngay sau khi ch?n ?on b?nh ti?u ???ng tp 2 v sau ? l hng n?m.  Th?c hi?n ch?m sc da v bn chn hng ngy. Ki?m tra da v bn chn hng ngy xem c v?t c?t, v?t b?m tm, t?y ??, v?n ?? v? mng, ch?y mu, m?n n??c hay l? lot khng. Bn chn c?n ???c chuyn gia ch?m sc s?c kh?e khm hng n?m.  ?nh r?ng v l?i t nh?t hai l?n m?i ngy   v dng ch? nha khoa t nh?t m?t l?n m?i ngy. G?p nha s? ?? khm l?i th??ng xuyn.  Chia s? k? ho?ch qu?n l b?nh ti?u ???ng c?a qu v? ? n?i lm vi?c ho?c tr??ng h?c c?a qu v?.  C?p nh?t l?ch tim ch?ng c?a qu v?. Qu v? nn tim v?c xin cm (b?nh cm) hng n?m. Qu v? c?ng nn tim v?c xin vim ph?i (ph? c?u khu?n). N?u qu v? 65 tu?i tr? ln v ch?a ???c tim v?c xin vim ph?i, v?c xin ny c th? ???c tim m?t lo?t hai m?i ring. Hy h?i chuyn gia ch?m sc s?c kh?e xem nn tim thm lo?i v?c xin no.  H?c cch qu?n l c?ng th?ng.  Xin ???c gio d?c v h? tr? v? b?nh ti?u ???ng th??ng xuyn khi c?n.  Tham gia ho?c tm cch ph?c h?i ch?c n?ng khi c?n thi?t ?? duy tr ho?c c?i thi?n kh? n?ng ??c l?p v ch?t l??ng cu?c s?ng. Yu c?u chuy?n sang v?t l tr? li?u ho?c li?u php ngh? nghi?p n?u qu v? b? t bn chn ho?c t tay, ho?c kh ch?i ??u, kh m?c qu?n o, ?n u?ng ho?c ho?t ??ng th? ch?t. ?I KHM N?U:   Qu v? khng th? ?n ho?c u?ng trong h?n 6 ti?ng.  Qu v? b? bu?n nn v nn m?a trong h?n 6 ti?ng.  L??ng ???ng huy?t c?a qu v? cao trn 240 mg/dL.  C thay ??i tr?ng thi tinh th?n.  Qu v? b? thm m?t c?n b?nh nghim tr?ng.  Qu v? b? tiu ch?y trong h?n 6 ti?ng.  Qu v? ? b? ?m ho?c b? s?t trong m?t vi ngy v khng ?? h?n.  Qu v? b? ?au trong khi tham gia b?t k? ho?t ??ng thn th? no. NGAY L?P T?C ?I KHM N?U:  Qu v? b? kh th?.  Qu v? c l??ng ketone ? m?c trung bnh ??n cao.   Thng tin ny khng nh?m m?c ?ch thay th? cho l?i khuyn m chuyn gia ch?m  sc s?c kh?e ni v?i qu v?. Hy b?o ??m qu v? ph?i th?o lu?n b?t k? v?n ?? g m qu v? c v?i chuyn gia ch?m sc s?c kh?e c?a qu v?.   Document Released: 07/25/2005 Document Revised: 04/15/2015 Elsevier Interactive Patient Education 2016 [employer]  

## 2016-02-02 NOTE — Addendum Note (Signed)
Addended by: Jaclyn ShaggyAMAO, Latriece Anstine on: 02/02/2016 01:09 PM   Modules accepted: Level of Service

## 2016-02-02 NOTE — Progress Notes (Signed)
Subjective:  Patient ID: Cindy Wheeler, female    DOB: [birth year]  Age: 55 y.o. MRN: [id number]  CC: Diabetes and Lupus   HPI Cindy Wheeler is a 55 year old female with a history of type 2 diabetes mellitus (diet controlled A1c 6.5), interstitial lung disease, lupus pneumonitis (improved with prednisone and CellCept), previous history of TB (treated from 12/09/14 until 06/29/15) who was [facility] from 01/28/16-01/30/16 for suicidal ideation after bronchoscopy for evaluation of active TB. Behavioral Health refused to take the patient due to active workup for TB; psych felt she was stable for discharge. She was subsequently discharged to follow-up with [facility] outpatient.  The patient is accompanied by an interpreter today who informs me the patient never had suicidal ideation or intent . The patient had indicated pleuritic chest pain and her lack of insurance which was hindering her medical care as a source of distress which was translated wrongly and so they cancelled her appointment at [facility].  She continues to have right upper quadrant pain which is worse when she inhales and also on palpation. She last saw pulmonary this month and has an upcoming appointment next month; never picked up the Symbicort prescription due to cost. Pathology report from [facility] negative for malignancy, AFB neg.   Past Medical History  Diagnosis Date  . Diabetes mellitus without complication (HCC)   . Hyperlipidemia   . Lupus (HCC)   . Hypertension   . Tuberculosis     Past Surgical History  Procedure Laterality Date  . No past surgeries    . Video bronchoscopy Bilateral 01/28/2016    Procedure: VIDEO BRONCHOSCOPY WITHOUT FLUORO;  Surgeon: Louann SjogrenJose Angelo A de Dios, MD;  Location[facility] ENDOSCOPY;  Service: Cardiopulmonary;  Laterality: Bilateral;     Outpatient Prescriptions Prior to Visit  Medication Sig Dispense Refill  . amoxicillin (AMOXIL) 500 MG capsule Take 1 capsule (500 mg total) by mouth 3  (three) times daily. (Patient not taking: Reported on 02/02/2016) 21 capsule 0  . budesonide-formoterol (SYMBICORT) 80-4.5 MCG/ACT inhaler Inhale 2 puffs into the lungs 2 (two) times daily. (Patient not taking: Reported on 02/02/2016) 1 Inhaler 0  . feeding supplement, ENSURE ENLIVE, (ENSURE ENLIVE) LIQD Take 237 mLs by mouth daily at 3 pm. (Patient not taking: Reported on 02/02/2016) 237 mL 12  . ranitidine (ZANTAC) 150 MG tablet Take 150 mg by mouth 2 (two) times daily. Reported on 02/02/2016    . omeprazole (PRILOSEC) 20 MG capsule Take 1 capsule (20 mg total) by mouth daily. (Patient not taking: Reported on 02/02/2016) 30 capsule 0   No facility-administered medications prior to visit.    ROS Review of Systems  Constitutional: Negative for activity change and appetite change.  HENT: Negative for sinus pressure and sore throat.   Respiratory: Negative for chest tightness, shortness of breath and wheezing.   Cardiovascular: Positive for chest pain ( pleuritic chest pain). Negative for palpitations.  Gastrointestinal: Positive for abdominal pain (Right upper quadrant). Negative for constipation and abdominal distention.  Genitourinary: Negative.   Musculoskeletal: Negative.   Psychiatric/Behavioral: Negative for behavioral problems and dysphoric mood.    Objective:  BP 127/83 mmHg  Pulse 98  Temp(Src) 98.5 F (36.9 C) (Oral)  Resp 18  Ht 4\' 10"  (1.473 m)  Wt 87 lb 3.2 oz (39.554 kg)  BMI 18.23 kg/m2  SpO2 97%  BP/Weight 02/02/2016 01/30/2016 01/29/2016  Systolic BP 127 132 -  Diastolic BP 83 63 -  Wt. (Lbs) 87.2 - 90  BMI  18.23 - -      Physical Exam  Constitutional: She is oriented to person, place, and time. She appears well-developed and well-nourished.  Cardiovascular: Normal rate, normal heart sounds and intact distal pulses.   No murmur heard. Pulmonary/Chest: Effort normal and breath sounds normal. She has no wheezes. She has no rales. She exhibits no tenderness.    Abdominal: Soft. Bowel sounds are normal. She exhibits no distension and no mass. There is tenderness (right upper quadrant).  Musculoskeletal: Normal range of motion.  Neurological: She is alert and oriented to person, place, and time.       CLINICAL DATA: New onset chest pain with inspiration, shortness of breath with exertion since May.  EXAM: CT CHEST WITHOUT CONTRAST  TECHNIQUE: Multidetector CT imaging of the chest was performed following the standard protocol without intravenous contrast. High resolution imaging of the lungs, as well as inspiratory and expiratory imaging, was performed.  COMPARISON: Chest radiograph 01/05/2016.  FINDINGS: Mediastinum/Lymph Nodes: Low left internal jugular lymph node measures 8 mm. Mediastinal lymph nodes measure up to 9 mm in the low right paratracheal station. Calcified subcarinal lymph nodes. Hilar regions are difficult to definitively evaluate without IV contrast. There are hazy axillary lymph nodes bilaterally, none of which are enlarged by CT size criteria. Coronary artery calcification. Heart is mildly enlarged. No pericardial effusion. Pre pericardiac lymph nodes are sub cm in short axis size.  Lungs/Pleura: There is somewhat basilar predominant mild subpleural reticulation with traction bronchiolectasis. No definite honeycombing. Image quality is somewhat degraded by respiratory motion. No air trapping. No pleural fluid. Airway is unremarkable.  Upper abdomen: Visualized portions of the liver, left adrenal gland and left kidney are unremarkable. Numerous calcifications in the spleen. Visualized portions of the pancreas, stomach and bowel are grossly unremarkable. No upper abdominal adenopathy.  Musculoskeletal: No worrisome lytic or sclerotic lesions.  IMPRESSION: 1. Pulmonary parenchymal pattern of mild fibrosis may be due to nonspecific interstitial pneumonitis or usual interstitial pneumonitis. 2. Numerous  subcentimeter hazy mediastinal and axillary lymph nodes. While mediastinal adenopathy can be seen in the setting of interstitial lung disease, axillary adenopathy is unexpected. A lymphoproliferative disorder cannot be excluded. Sarcoid is considered less likely given absence of perilymphatic nodularity in the lungs. 3. Coronary artery calcification.   Electronically Signed  By: Leanna BattlesMelinda Blietz M.D.  On: 01/20/2016 10:17   CLINICAL DATA: 55 year old female with right upper quadrant abdominal pain for several weeks. Initial encounter.  EXAM: ABDOMEN ULTRASOUND COMPLETE  COMPARISON: High-resolution chest CT 01/20/2016  FINDINGS: Gallbladder: No gallstones or wall thickening visualized. No sonographic Murphy sign noted by sonographer.  Common bile duct: Diameter: 4 mm, normal  Liver: Echogenicity of the liver parenchyma appears mildly to moderately increased (image 42). No intrahepatic biliary ductal dilatation or discrete liver lesion.  IVC: No abnormality visualized.  Pancreas: Visualized portion unremarkable.  Spleen: Nonenlarged, 6.8 cm in length. Punctate calcified granulomas re - demonstrated.  Right Kidney: Length: 10.0 cm. Echogenicity within normal limits. No mass or hydronephrosis visualized.  Left Kidney: Length: 11.0 cm. Not as well visualized as the right kidney, but no hydronephrosis or left renal mass is evident.  Abdominal aorta: No aneurysm visualized.  Other findings: None.  IMPRESSION: Normal gallbladder. Negative sonographic appearance of the abdomen aside from possible hepatic steatosis.   Electronically Signed  By: Odessa FlemingH Hall M.D.  On: 01/29/2016 08:40  Assessment & Plan:   1. Type 2 diabetes mellitus without complication, without long-term current use of insulin (HCC) Diet controlled with A1c  of 6.5 Educated on blood sugar goals of 80-120 fasting Discussed my treatment., Reducing carbs Diabetic health care  maintenance at next visit. - Glucose (CBG) - glucose blood (TRUE METRIX BLOOD GLUCOSE TEST) test strip; Use daily before breakfast  Dispense: 100 each; Refill: 12 - Blood Glucose Monitoring Suppl (TRUE METRIX METER) DEVI; 1 each by Does not apply route daily before breakfast.  Dispense: 1 Device; Refill: 0 - TRUEPLUS LANCETS 28G MISC; 1 each by Does not apply route daily before breakfast.  Dispense: 30 each; Refill: 12  2. ILD (interstitial lung disease)/ lupus pneumonitis (HCC) Still with pleuritic chest pain Patient never picked her up Symbicort due to financial constraints She is also wondering if she really needs it. Notes from pulmonary indicate once active TB has been ruled out, she will be placed on prednisone/CellCept. Advised to keep appointment with pulmonary on 02/11/16 to discuss this  3. RUQ pain Abdominal ultrasound negative for gallstones Cannot exclude pleurisy - sucralfate (CARAFATE) 1 g tablet; Take 1 tablet (1 g total) by mouth 4 (four) times daily -  with meals and at bedtime.  Dispense: 120 tablet; Refill: 1 - omeprazole (PRILOSEC) 20 MG capsule; Take 1 capsule (20 mg total) by mouth daily.  Dispense: 30 capsule; Refill: 1   Meds ordered this encounter  Medications  . glucose blood (TRUE METRIX BLOOD GLUCOSE TEST) test strip    Sig: Use daily before breakfast    Dispense:  100 each    Refill:  12  . Blood Glucose Monitoring Suppl (TRUE METRIX METER) DEVI    Sig: 1 each by Does not apply route daily before breakfast.    Dispense:  1 Device    Refill:  0  . TRUEPLUS LANCETS 28G MISC    Sig: 1 each by Does not apply route daily before breakfast.    Dispense:  30 each    Refill:  12  . DISCONTD: omeprazole (PRILOSEC) 20 MG capsule    Sig: Take 1 capsule (20 mg total) by mouth daily.    Dispense:  30 capsule    Refill:  0  . sucralfate (CARAFATE) 1 g tablet    Sig: Take 1 tablet (1 g total) by mouth 4 (four) times daily -  with meals and at bedtime.    Dispense:   120 tablet    Refill:  1  . omeprazole (PRILOSEC) 20 MG capsule    Sig: Take 1 capsule (20 mg total) by mouth daily.    Dispense:  30 capsule    Refill:  1    Follow-up: Return in about 1 month (around 03/03/2016) for Follow-up on right upper quadrant pain.   Jaclyn Shaggy MD

## 2016-02-03 LAB — ASPERGILLUS ANTIGEN, BAL/SERUM: Aspergillus Ag, BAL/Serum: 0.08 Index (ref 0.00–0.49)

## 2016-02-03 NOTE — Progress Notes (Signed)
Quick Note:  Letter has been sent. ______

## 2016-02-11 ENCOUNTER — Ambulatory Visit (INDEPENDENT_AMBULATORY_CARE_PROVIDER_SITE_OTHER): Payer: Self-pay | Admitting: Pulmonary Disease

## 2016-02-11 ENCOUNTER — Encounter: Payer: Self-pay | Admitting: Pulmonary Disease

## 2016-02-11 VITALS — BP 122/68 | HR 105 | Ht <= 58 in | Wt 90.6 lb

## 2016-02-11 DIAGNOSIS — M329 Systemic lupus erythematosus, unspecified: Secondary | ICD-10-CM

## 2016-02-11 DIAGNOSIS — A150 Tuberculosis of lung: Secondary | ICD-10-CM

## 2016-02-11 DIAGNOSIS — J849 Interstitial pulmonary disease, unspecified: Secondary | ICD-10-CM

## 2016-02-11 MED ORDER — PREDNISONE 10 MG PO TABS: ORAL_TABLET | ORAL | Status: AC

## 2016-02-11 MED FILL — predniSONE 10 MG TABS: 10 | 14 days supply | Qty: 21 | Fill #0

## 2016-02-11 NOTE — Progress Notes (Signed)
Subjective:    Patient ID: Cindy Wheeler, female    DOB: 1961/07/01, 55 y.o.   MRN: [id number]  HPI   This is the case of Cindy Wheeler, 55 y.o. Female, who was referred by Dr. [name]  in consultation regarding dyspnea and her lung issues.   As you very well know, patient is [place] and I was able to communicate with her with an interpreter.  Pt was recently seen at ED for abd pain. Given her lung issues, she was sent to our office to be seen.   [place] 2003. She denies any lung issues until 2016. No history of asthma, copd, PTB until 2016. She has been in [place] since 2003. She visited [place] in 2015 and she stayed there for 3 mos. While she was visiting, she end up being sick -- had fever, chills, weight loss, cough, dyspnea, joint pains.  [place] in 09[place].  [place] stop was [place], [place].  I think she was very symptomatic so she ended up seeing doctors and was admitted at [facility].  Pt was seen by Pulm and ID.  She had acute hypoxemic respiratory failure and diffuse pulmonary infiltrates.  She end up having bronchoscopy.    Pt was treated as lupus pneumonitis while in [place].  She ended up being on high steroid and prednisone doses as well as CellCept and Bactrim for PCP prophylaxis.  She was being seen by Dr. [name].  The last office note by Dr. [name] (March 05, 2015) stated that pt had clinically and radiographically  improved with prednisone (she was on 30 mg/d) as well as CellCept  (500 mg in am and 1000 mg in pm). The plan was to wean off prednisone. She would need to see pulmonary as she had planned to move back to [place].   Pt also ended up being (+) for PTB with the BAL from bronchoscopy. She ended up being on 4 medications for PTB and she was actively managed by [facility] in [place].  Per pt, she was given 4 meds (Ethambutol, Isoniazid, Rifampin) for 6 months.  She had stopped the TB meds even before  leaving for [place].  Pt ended up coming back to [place] in 09/[place].  She was NOT able to see any MD until her ER visit end of May 2017.  She ran out of prednisone in 11/2015.  She also was not able to get any CellCept since 09/2015. She has remained asymptomatic until end of May 2017.  She started having episodic substernal discomfort.  That resolved after < 1 week.  She then had RUQ episodic pain which she describes bothers her with inspiration and she denied any association with food.  She presented to ED with this and no cause was found for her RUQ pain. Other than her pain and her persistent poor appetite (but denies significant weight loss), she remains relatively OK. She denies being "sick" again just like how she was in 09/2014 in [place].    ROV (02/11/16) Pt returns to office after bronchoscopy. She tolerated procedure well.  She had screening questions (+) for depression/SI so she ended up being admitted after bronch. Psychiatry was consulted and eventually pt was deemed non suicidal.  Since last seen, she continues to have "pleuritic" RLL cp. Worse with exertion. Subjective feeling of unwell with joint pains. On and off cough. (-) fevers. (-) hemoptysis. Was denied for Disability and  Medicaid. Sister supports her financially.   Review of Systems  Constitutional: Negative.  Negative for fever, chills and unexpected weight change.  HENT: Negative.  Negative for congestion, dental problem, ear pain, nosebleeds, postnasal drip, rhinorrhea, sinus pressure, sneezing, sore throat and trouble swallowing.   Eyes: Negative.  Negative for redness and itching.  Respiratory: Positive for cough and shortness of breath. Negative for chest tightness and wheezing.   Cardiovascular: Negative for palpitations and leg swelling.  Gastrointestinal: Positive for abdominal pain. Negative for nausea and vomiting.       RUQ abd pain x 1-2 weeks, worse with inspiration.   Genitourinary: Negative for dysuria.    Musculoskeletal: Positive for joint swelling and arthralgias.  Skin: Positive for rash.  Allergic/Immunologic: Negative.   Neurological: Positive for dizziness and light-headedness. Negative for headaches.  Hematological: Negative.  Does not bruise/bleed easily.  Psychiatric/Behavioral: Negative.  Negative for dysphoric mood. The patient is not nervous/anxious.      Objective:   Physical Exam  Vitals:  Filed Vitals:   02/11/16 1644  BP: 122/68  Pulse: 105  Height: 4\' 10"  (1.473 m)  Weight: 90 lb 9.6 oz (41.096 kg)  SpO2: 97%    Constitutional/General:  Pleasant, well-nourished, well-developed, not in any distress,  Comfortably seating.  Well kempt  Body mass index is 18.94 kg/(m^2). Wt Readings from Last 3 Encounters:  02/11/16 90 lb 9.6 oz (41.096 kg)  02/02/16 87 lb 3.2 oz (39.554 kg)  01/29/16 90 lb (40.824 kg)      HEENT: Pupils equal and reactive to light and accommodation. Anicteric sclerae. Normal nasal mucosa.   No oral  lesions,  mouth clear,  oropharynx clear, no postnasal drip. (-) Oral thrush. No dental caries.  Airway - Mallampati class III  Neck: No masses. Midline trachea. No JVD, (-) LAD. (-) bruits appreciated.  Respiratory/Chest: Grossly normal chest. (-) deformity. (-) Accessory muscle use.  Symmetric expansion. (-) Tenderness on palpation.  Resonant on percussion.  Diminished BS on both lower lung zones. (-) wheezing. (-) egophony. Crackles mid-bases. Occasional rhonchi.   Cardiovascular: Regular rate and  rhythm, heart sounds normal, no murmur or gallops, no peripheral edema  Gastrointestinal:  Normal bowel sounds. Soft. No hepatosplenomegaly. Some RUQ tenderness. (-) rebound.  (-) masses.   Musculoskeletal:  Normal muscle tone. Normal gait.   Extremities: Grossly normal. (-) clubbing, cyanosis. Swelling seen in fingers.  (-) edema  Skin: (-) rash,lesions seen.   Neurological/Psychiatric : alert, oriented to time, place, person. Normal  mood and affect       Assessment & Plan:  ILD (interstitial lung disease) (HCC) Pt had diffuse pulmonary infiltrates and hypoxemia in 09/2014. She was symptomatic with fevers, chills, cough, weight loss and SOB. Treated as lupus pneumonitis with high dose prednisone and Cell Cept (500 mg in am and 100 mg in pm).  She was in [place] during this time and moved back to [place] in 09/2015. She ran out of Cell Cept in 09/2015 and Prednisone (30 mg/d) in 11/2015. She remains asymptomatic except for a 2 week h/o RUQ pain in 12/2015.  CXR in 12/2015 > No definite infiltrate seen.  CXR in 02/2015 > mild bibasilar infiltrates (per records) HRCT (01/2016) > mild fibrosis at the bases. Some mediastinal LAD. (-) infiltrates seen. Bronch (01/2016) > culture so far are all (-). AFB, fungal, viral, bacterial cultures are (-).  Her RLL pleuritic cp could be a manifestation of her ILD flare up.  Could also be related to lupus pneumonitis.  Pt has no insurance.  Trial with prednisone 20 mg/d for 1 week followed by 10 mg/d for 1 week.   Pt to call after 2 weeks of prednisone.  May need chronic pred but I am hesitant to give.  Will try cellcept again but not sure about cost and f/u.    TB (pulmonary tuberculosis) Pt had diffuse lung infiltrates in 09/2014. Had bronch and BAL which showed PTB.  Was being manged by [facility] in [place].  Received notes from  [employer] program in [place].  [address] [place], [place] [address], [place] Phone [phone].   Per notes, pt was treated for active TB from 12/09/14 until 06/29/15.  She was given 2 mos of INH, RIF, PYZ daily followed by 4 mos of INH and RIF 3x/week.   Sputum conversion transpired on 01/20/15, 6 weeks into treatment.   She demonstrated radiographic, clinical, and microbiologic conversion  Bronch in 01/2016 > AFB stain (-) so far.   Lupus (HCC) Pt treated as severe lupus pneumonitis in 09/2014. Was on prednisone and cellcept. These meds were  weaned off by the pt by 11/2015.   Try prednisone for 2 weeks and see if she gets better.  May need to see Rheum in order to get disability.      Patient will follow up with me in 4 months.  Patient DOES NOT speak English. Interview was conducted with the help of a [place] interpreter.     Pollie Meyer, MD 02/11/2016   9:57 PM Pulmonary and Critical Care Medicine [facility] Pager: [phone] Office: [phone], Fax: [phone]

## 2016-02-11 NOTE — Assessment & Plan Note (Signed)
Pt had diffuse lung infiltrates in 09/2014. Had bronch and BAL which showed PTB.  Was being manged by [facility] in [place].  Received notes from  [employer] in [place].  [address], [place] [id number], [place] Phone [phone].   Per notes, pt was treated for active TB from 12/09/14 until 06/29/15.  She was given 2 mos of INH, RIF, PYZ daily followed by 4 mos of INH and RIF 3x/week.   Sputum conversion transpired on 01/20/15, 6 weeks into treatment.   She demonstrated radiographic, clinical, and microbiologic conversion  Bronch in 01/2016 > AFB stain (-) so far.

## 2016-02-11 NOTE — Assessment & Plan Note (Signed)
Pt treated as severe lupus pneumonitis in 09/2014. Was on prednisone and cellcept. These meds were weaned off by the pt by 11/2015.   Try prednisone for 2 weeks and see if she gets better.  May need to see Rheum in order to get disability.

## 2016-02-11 NOTE — Assessment & Plan Note (Signed)
Pt had diffuse pulmonary infiltrates and hypoxemia in 09/2014. She was symptomatic with fevers, chills, cough, weight loss and SOB. Treated as lupus pneumonitis with high dose prednisone and Cell Cept (500 mg in am and 100 mg in pm).  She was in [place] during this time and moved back to [place] in 09/2015. She ran out of Cell Cept in 09/2015 and Prednisone (30 mg/d) in 11/2015. She remains asymptomatic except for a 2 week h/o RUQ pain in 12/2015.  CXR in 12/2015 > No definite infiltrate seen.  CXR in 02/2015 > mild bibasilar infiltrates (per records) HRCT (01/2016) > mild fibrosis at the bases. Some mediastinal LAD. (-) infiltrates seen. Bronch (01/2016) > culture so far are all (-). AFB, fungal, viral, bacterial cultures are (-).  Her RLL pleuritic cp could be a manifestation of her ILD flare up.  Could also be related to lupus pneumonitis.   Pt has no insurance.  Trial with prednisone 20 mg/d for 1 week followed by 10 mg/d for 1 week.   Pt to call after 2 weeks of prednisone.  May need chronic pred but I am hesitant to give.  Will try cellcept again but not sure about cost and f/u.

## 2016-02-11 NOTE — Patient Instructions (Addendum)
It was a pleasure taking care of you today!  You are diagnosed with  Lupus and pulmonary fibrosis.   We will try you on prednisone:  20 mg a day for 1 week followed by 10 mg a day for 1 week. His call the office after 2 weeks to update us. Please call the office if you are having adverse reaction to meds/antibiotics.  Please call the office your symptoms are getting worse despite the meds/antibiotics.    Return to clinic in 4 months.   ? l m?t ni?m vui ch?m Rainelle c?a b?n ngy hm nay! B?n ???c ch?n ?on c Lupus v x? ph?i. Chng ti s? th? b?n trn prednisone: 20 mg m?t ngy trong 1 tu?n ti?p theo l 10 mg m?t ngy trong 1 tu?n. ng g?i v?n phng ny sau 2 tu?n ?? c?p nh?t cho chng ti. Hy g?i cho v?n phng n?u b?n ?ang c ph?n ?ng b?t l?i ??i v?i thu?c / khng sinh. Xin vui lng g?i cho v?n phng tri?u ch?ng c?a b?n ?ang tr? nn t?i t? m?c d cc lo?i thu?c / khng sinh. Quay tr? l?i phng khm trong vng 4 thng.

## 2016-02-25 ENCOUNTER — Other Ambulatory Visit: Payer: Self-pay | Admitting: Family Medicine

## 2016-02-25 ENCOUNTER — Telehealth: Payer: Self-pay

## 2016-02-25 LAB — FUNGAL ORGANISM REFLEX

## 2016-02-25 LAB — FUNGUS CULTURE WITH STAIN

## 2016-02-25 LAB — FUNGUS CULTURE RESULT

## 2016-02-25 MED ORDER — FLUCONAZOLE 150 MG PO TABS: 150.0000 mg | ORAL_TABLET | Freq: Once | ORAL | Status: AC

## 2016-02-25 MED FILL — FLUCONAZOLE 150 MG TABLET: 150 | 7 days supply | Qty: 7 | Fill #0

## 2016-02-25 NOTE — Telephone Encounter (Signed)
-----   Message from Jaclyn ShaggyEnobong Amao, MD sent at 02/25/2016  2:30 PM EDT ----- Her culture from hospitalization was positive for Candida and so I have sent a prescription for Diflucan to her pharmacy.

## 2016-02-25 NOTE — Telephone Encounter (Signed)
[employer] interpre[employer] [phone] assistance by interpreter ID [id number]. Voicemail left requesting return call;  Attempted to advise patient:   Her culture from hospitalization was positive for Candida and so I have sent a prescription for Diflucan to her pharmacy.

## 2016-03-02 ENCOUNTER — Telehealth: Payer: Self-pay | Admitting: Pulmonary Disease

## 2016-03-02 ENCOUNTER — Encounter: Payer: Self-pay | Admitting: Family Medicine

## 2016-03-02 ENCOUNTER — Ambulatory Visit: Payer: Self-pay | Attending: Family Medicine | Admitting: Family Medicine

## 2016-03-02 VITALS — BP 132/80 | HR 102 | Temp 98.5°F | Ht <= 58 in | Wt 90.0 lb

## 2016-03-02 DIAGNOSIS — J849 Interstitial pulmonary disease, unspecified: Secondary | ICD-10-CM

## 2016-03-02 DIAGNOSIS — M329 Systemic lupus erythematosus, unspecified: Secondary | ICD-10-CM

## 2016-03-02 DIAGNOSIS — E119 Type 2 diabetes mellitus without complications: Secondary | ICD-10-CM

## 2016-03-02 LAB — GLUCOSE, POCT (MANUAL RESULT ENTRY): POC GLUCOSE: 113 mg/dL — AB (ref 70–99)

## 2016-03-02 NOTE — Progress Notes (Signed)
Medication refills Painful right abdomen Pain in fingers and legs

## 2016-03-02 NOTE — Telephone Encounter (Signed)
Attempted to contact [facility]. The line rang several times with no answer. Will try back.

## 2016-03-02 NOTE — Patient Instructions (Signed)
Systemic Lupus Erythematosus, Adult  Systemic lupus erythematosus is a long-term (chronic) disease that can affect many parts of the body. It can damage the skin, joints, blood vessels, brain, kidneys, lungs, heart, and other internal organs. It causes pain, irritation, and inflammation.  Systemic lupus erythematosus is an autoimmune disease. With this type of disease, the body's defense system (immune system) mistakenly attacks normal tissues instead of attacking germs or abnormal growths.  CAUSES  The cause of this condition is not known.  RISK FACTORS  This condition is more likely to develop in:  · Females.  · People of Asian descent.  · People of African-American descent.  · People who have a family history of the condition.  SYMPTOMS  General symptoms include:  · Joint pain and swelling (common).  · Fever.  · Fatigue.  · Unusual weight loss or weight gain.  · Skin rashes, especially over the nose and cheeks (butterfly rash) and after sun exposure.  · Sores inside the mouth or nose.  Other symptoms depend on which parts of the body are affected. They can include:  · Shortness of breath.  · Chest pain.  · Frequent urination.  · Blood in the urine.  · Seizures.  · Mental changes.  · Hair loss.  · Swollen and tender lymph nodes.  · Swelling of the hands or feet.  Symptoms can come and go. A period of time when symptoms get worse or come back is called a flare. A period of time with no symptoms is called a remission.  DIAGNOSIS  This condition is diagnosed based on symptoms, a medical history, and a physical exam. You may also have tests, including:  · Blood tests.  · Urine tests.  · A chest X-ray.  · A skin or kidney biopsy. For this test, a sample of tissue is taken from the skin or kidney and studied under a microscope.  You may be referred to an autoimmune disease specialist (rheumatologist).  TREATMENT  There is no cure for this condition, but treatment can keep the disease in remission, help to control  symptoms, and prevent damage to the heart, lungs, kidneys, and other organs. Treatment may involve taking a combination of medicines over time.  HOME CARE INSTRUCTIONS  Medicines  · Take medicines only as directed by your health care provider.  · Do not take any medicines that contain estrogen without first checking with your health care provider. Estrogen can trigger flares and may increase your risk for blood clots.  Lifestyle  · Eat a heart-healthy diet.  · Stay active as directed by your health care provider.  · Do not smoke. If you need help quitting, ask your health care provider.  · Protect your skin from the sun by applying sunblock and wearing protective hats and clothing.  · Learn as much as you can about your condition and have a good support system in place. Support may come from family, friends, or a lupus support group.  General Instructions  · Keep all follow-up visits as directed by your health care provider. This is important.  · Work closely with all of your health care providers to manage your condition.  · Let your health care provider know right away if you become pregnant or if you plan to become pregnant. Pregnancy in women with this condition is considered high risk.  SEEK MEDICAL CARE IF:  · You have a fever.  · Your symptoms flare.  · You develop new symptoms.  ·   You develop swollen feet or hands.  · You develop puffiness around your eyes.  · Your medicines are not working.  · You have bloody, foamy, or coffee-colored urine.  · There are changes in your urination. For example, you urinate more often at night.  · You think that you may be depressed or have anxiety.  SEEK IMMEDIATE MEDICAL CARE IF:  · You have chest pain.  · You have trouble breathing.  · You have a seizure.  · You suddenly get a very bad headache.  · You suddenly develop facial or body weakness.  · You cannot speak.  · You cannot understand speech.     This information is not intended to replace advice given to you by your  health care provider. Make sure you discuss any questions you have with your health care provider.     Document Released: 07/15/2002 Document Revised: 12/09/2014 Document Reviewed: 07/02/2014  Elsevier Interactive Patient Education ©2016 Elsevier Inc.

## 2016-03-02 NOTE — Progress Notes (Signed)
Subjective:    Patient ID: Cindy Wheeler, female    DOB: 1961-02-22, 55 y.o.   MRN: [id number]  HPI Cindy Wheeler is a 55 year old female with a history of type 2 diabetes mellitus (diet controlled A1c 6.5), interstitial lung disease, lupus pneumonitis (previously treated with prednisone and CellCept), previous history of TB (treated from 12/09/14 until 06/29/15) here for a follow up visit.  She was seen by [facility] on 02/11/16 and was prescribed a 2 week course of Prednisone which she has completed.  She does not currently see a rheumatologist.  Complains of pain in her joints especially knuckles, alternating hot and cold sensation but no fever. Denies rhinorrhea, nasal congestion, post nasal drip. I had sent a prescription for Diflucan to her pharmacy due to a culture positive for Candida however she is yet to pick up the prescription.  Past Medical History:  Diagnosis Date  . Diabetes mellitus without complication (HCC)   . Hyperlipidemia   . Hypertension   . Lupus (HCC)   . Tuberculosis     Past Surgical History:  Procedure Laterality Date  . NO PAST SURGERIES    . VIDEO BRONCHOSCOPY Bilateral 01/28/2016   Procedure: VIDEO BRONCHOSCOPY WITHOUT FLUORO;  Surgeon: Louann Sjogren, MD;  Location: [facility] ENDOSCOPY;  Service: Cardiopulmonary;  Laterality: Bilateral;    No Known Allergies  Current Outpatient Prescriptions on File Prior to Visit  Medication Sig Dispense Refill  . budesonide-formoterol (SYMBICORT) 80-4.5 MCG/ACT inhaler Inhale 2 puffs into the lungs 2 (two) times daily. 1 Inhaler 0  . fluconazole (DIFLUCAN) 150 MG tablet Take 1 tablet (150 mg total) by mouth once. (Patient not taking: Reported on 03/02/2016) 7 tablet 0  . predniSONE (DELTASONE) 10 MG tablet TAKE  DAILY X1 WEEK, THEN TAKE  DAILY X1 WEEK (Patient not taking: Reported on 03/02/2016) 21 tablet 0   No current facility-administered medications on file prior to visit.      Review of  Systems Constitutional: Negative for activity change and appetite change.  HENT: Negative for sinus pressure and sore throat.   Respiratory: Negative for chest tightness, shortness of breath and wheezing.   Cardiovascular: Positive for right sided chest pain ( pleuritic chest pain). Negative for palpitations.  Gastrointestinal: Negative for abdominal pain. Negative for constipation and abdominal distention.  Genitourinary: Negative.   Musculoskeletal: see hpi.   Psychiatric/Behavioral: Negative for behavioral problems and dysphoric mood.        Objective: Vitals:   03/02/16 1033  BP: 132/80  Pulse: (!) 102  Temp: 98.5 F (36.9 C)  TempSrc: Oral  SpO2: 95%  Weight: 90 lb (40.8 kg)  Height:  (1.448 m)      Physical Exam Constitutional: She is oriented to person, place, and time. She appears well-developed and well-nourished.  Cardiovascular: Tachycardic rate, normal heart sounds and intact distal pulses.   No murmur heard. Pulmonary/Chest: Effort normal and breath sounds normal. She has no wheezes. She has no rales. She exhibits no tenderness.  Abdominal: Soft. Bowel sounds are normal. She exhibits no distension and no mass. There is tenderness (right upper quadrant)/ lower border of right chest wall .  Musculoskeletal: edematous proximal interphalangeal joint of right 4th and 5th fingers Neurological: She is alert and oriented to person, place, and time.      Assessment & Plan:  1. Type 2 diabetes mellitus without complication, without long-term current use of insulin (HCC) Diet controlled with A1c of 6.5 Educated on blood sugar goals of  80-120 fasting Discussed my plate method, reducing carbs Diabetic health care maintenance at next visit. - Glucose (CBG) - glucose blood (TRUE METRIX BLOOD GLUCOSE TEST) test strip; Use daily before breakfast  Dispense: 100 each; Refill: 12 - Blood Glucose Monitoring Suppl (TRUE METRIX METER) DEVI; 1 each by Does not apply route daily  before breakfast.  Dispense: 1 Device; Refill: 0 - TRUEPLUS LANCETS 28G MISC; 1 each by Does not apply route daily before breakfast.  Dispense: 30 each; Refill: 12  2. ILD (interstitial lung disease)/ lupus pneumonitis (HCC) Still with right sided pleuritic chest pain Completed two-week course of prednisone We have called [facility] on the patient's behalf as per recommendation from pulmonary: to call after she is done with her course of prednisone. Advised to keep appointment with pulmonary on 02/11/16  3. Lupus I have referred her to a rheumatologist as she currently has a lupus flare. We'll hold off on prescribing prednisone as she might be getting this from Pulmonary Educated that given she has the [facility] card and no medical coverage, the most likely option will be for [facility] and wait times have been discussed. OTC analgesics for myalgias

## 2016-03-03 NOTE — Telephone Encounter (Signed)
The number given was from [facility].  They are closed at this time and will open later this morning.

## 2016-03-04 ENCOUNTER — Telehealth: Payer: Self-pay | Admitting: Family Medicine

## 2016-03-04 NOTE — Telephone Encounter (Signed)
Called [facility], was told that Cindy Wheeler was unavailable and that she would return our call.  Will await call back.

## 2016-03-04 NOTE — Telephone Encounter (Signed)
[facility] called returning the call from the nurse. Rep stated that she needs to speak with the nurse regarding that pt. Rep also stated the nurse can speak with anyone in triage. Please f/u.

## 2016-03-04 NOTE — Telephone Encounter (Signed)
[facility] is not open at this time. Will route to triage to call later this AM.

## 2016-03-04 NOTE — Telephone Encounter (Signed)
Call Pulmonary back and was unable to speak with the nurse.  RN will call back.

## 2016-03-04 NOTE — Telephone Encounter (Signed)
Attempted to contact [facility]. I was placed on a long hold. Will try back.

## 2016-03-04 NOTE — Telephone Encounter (Signed)
Return [facility].Caren Griffins ]

## 2016-03-07 NOTE — Telephone Encounter (Signed)
Attempted to contact [facility]. Office is currently closed. We have tried to reach their office several times with no success.

## 2016-03-08 NOTE — Telephone Encounter (Signed)
[facility]ed [facility] and they are closed.  They will open at 9 am

## 2016-03-08 NOTE — Telephone Encounter (Signed)
Tried calling back and msg states call after 2 pm since they are closed for lunch

## 2016-03-08 NOTE — Telephone Encounter (Signed)
Called and spoke with Marcelino Duster  She states she believes that pt was requesting pred refill last time she was seen in their clinic  I advised she would need to contact us if she is already out of med and requesting more  She verbalized understanding and will contact the pt  Nothing further needed

## 2016-03-08 NOTE — Telephone Encounter (Signed)
Marcelino Duster returning call again [phone].Caren Griffins

## 2016-03-08 NOTE — Telephone Encounter (Signed)
Returned call to [facility] re pt request for med refill on Prednisone and something for pain.  Spoke to [employer] she would leave message for [facility] and triage that I returned their call.

## 2016-03-11 NOTE — Telephone Encounter (Signed)
Patient has an appt at [facility] on 04/13/16.

## 2016-03-12 LAB — ACID FAST CULTURE WITH REFLEXED SENSITIVITIES (MYCOBACTERIA)

## 2016-03-12 LAB — ACID FAST CULTURE WITH REFLEXED SENSITIVITIES: ACID FAST CULTURE - AFSCU3: NEGATIVE

## 2016-04-13 ENCOUNTER — Ambulatory Visit: Payer: Medicaid - Out of State | Admitting: Pulmonary Disease

## 2016-05-23 ENCOUNTER — Encounter (HOSPITAL_COMMUNITY): Payer: Self-pay | Admitting: Anesthesiology

## 2018-05-19 IMAGING — CT CT CHEST HIGH RESOLUTION W/O CM
2 of 6 series · 15 of 36 positions shown, 18 images · non-contrast
Comparison: Chest radiograph 01/05/2016.

CLINICAL DATA: New onset chest pain with inspiration, shortness of
breath with exertion [REDACTED].

EXAM:
CT CHEST WITHOUT CONTRAST
TECHNIQUE: Multidetector CT imaging of the chest was performed following the
standard protocol without intravenous contrast. High resolution
imaging of the lungs, as well as inspiratory and expiratory imaging,
was performed.

[Series 4: high resolution · axial · 0.51mm/px · z∈[-209,-5]mm · 12 of 114 slices shown, 15 images]
[im 6/114  mediastinal]
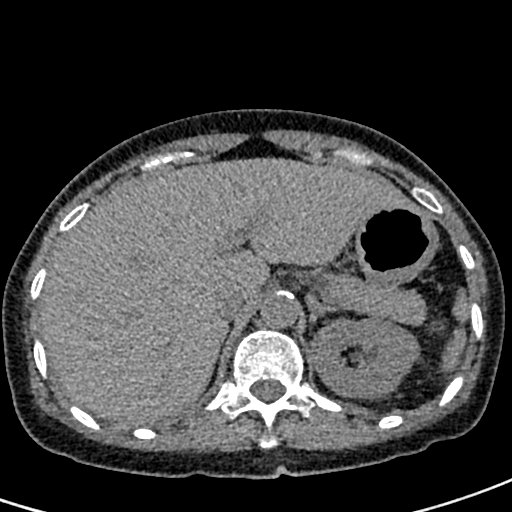
[im 6/114  lung]
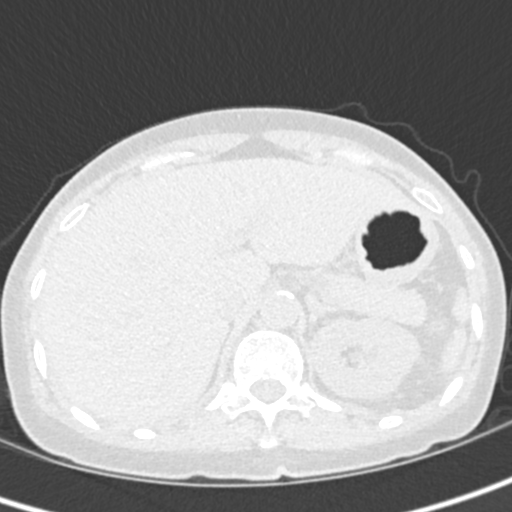
[im 18/114  lung]
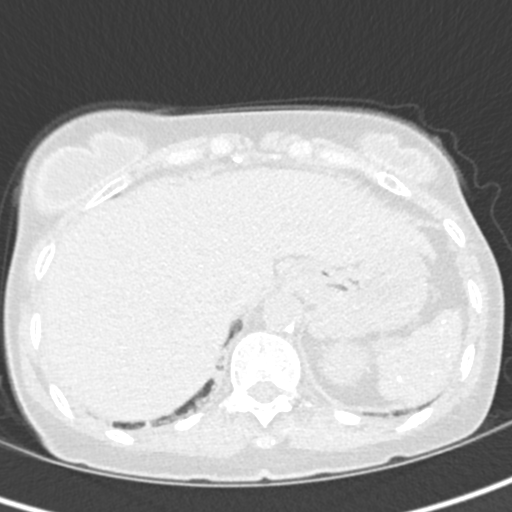
[im 24/114  lung]
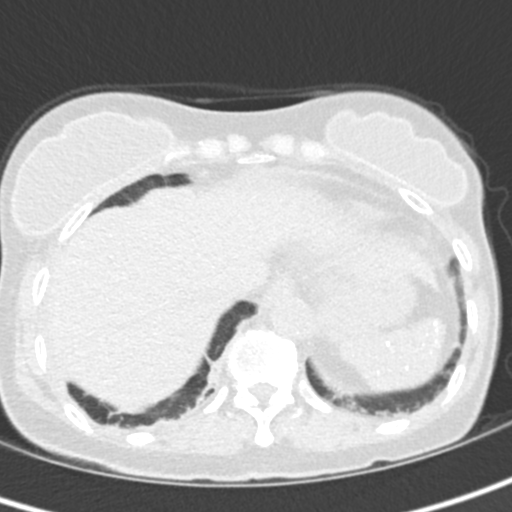
[im 36/114  lung]
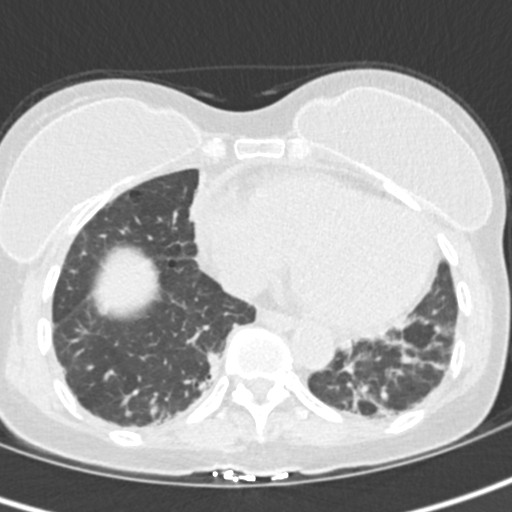
[im 42/114  mediastinal]
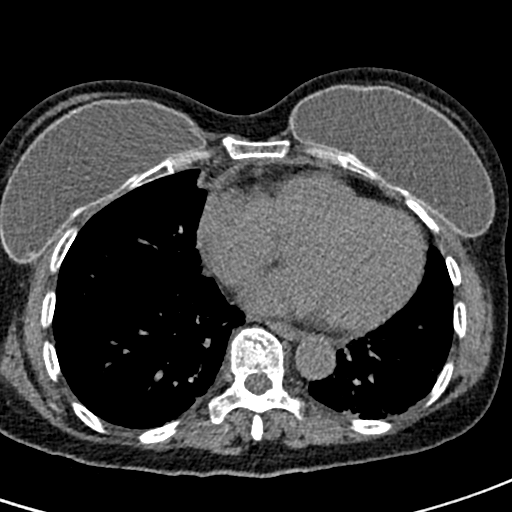
[im 42/114  lung]
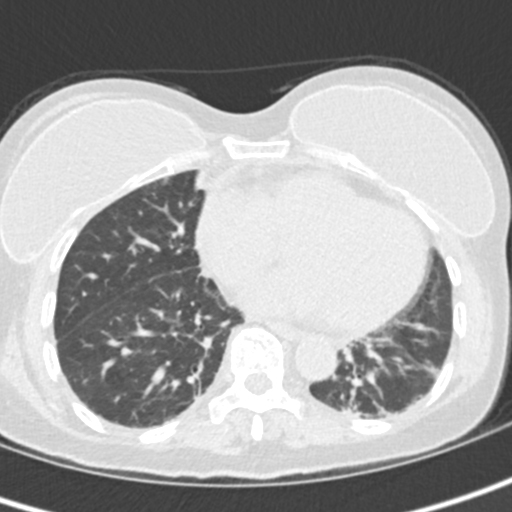
[im 54/114  lung]
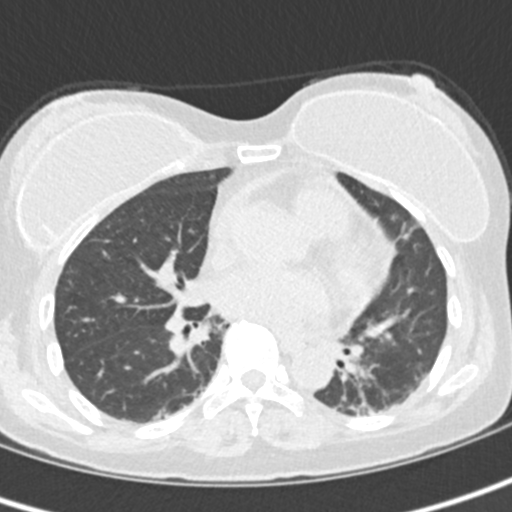
[im 60/114  lung]
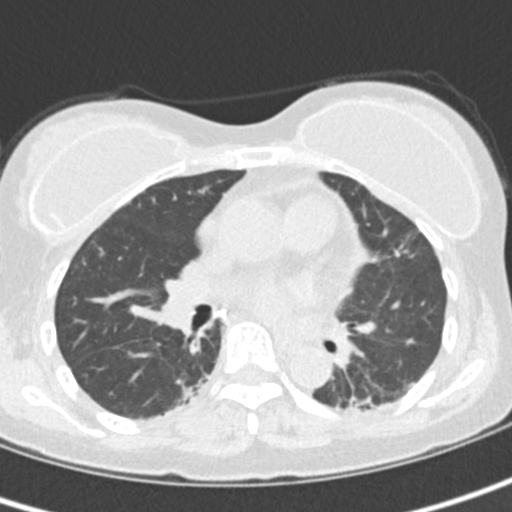
[im 72/114  lung]
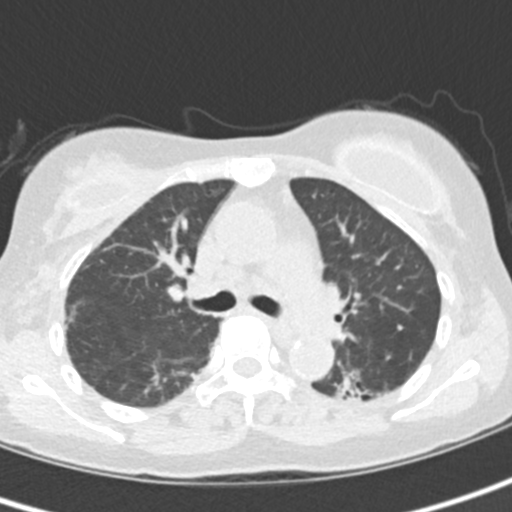
[im 78/114  mediastinal]
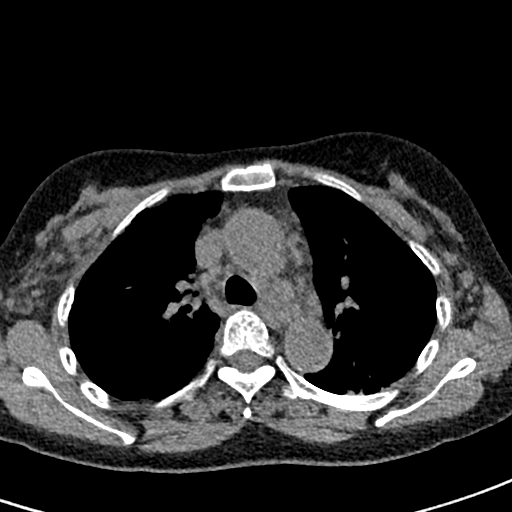
[im 78/114  lung]
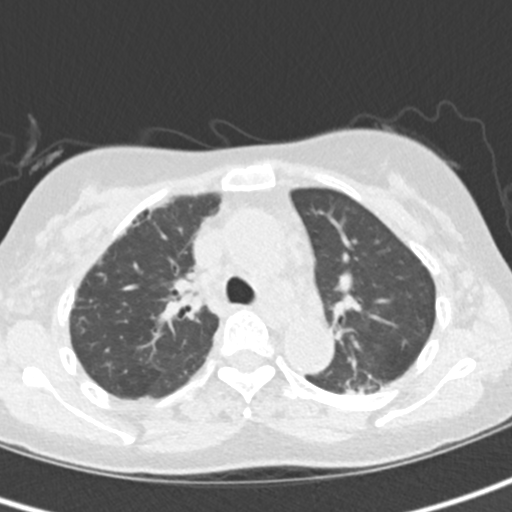
[im 90/114  lung]
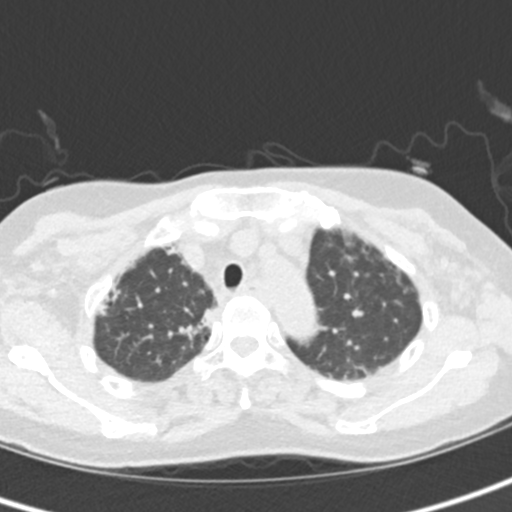
[im 96/114  lung]
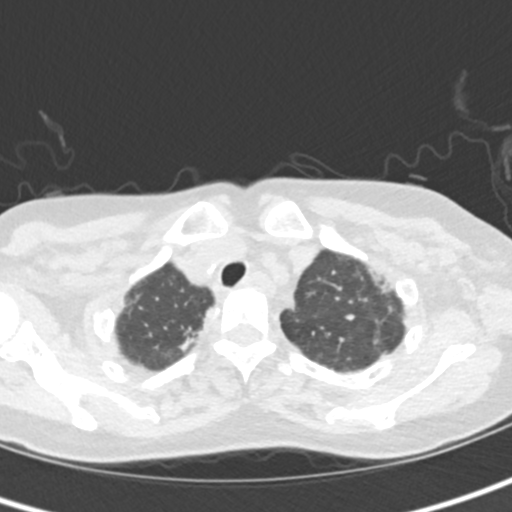
[im 108/114  lung]
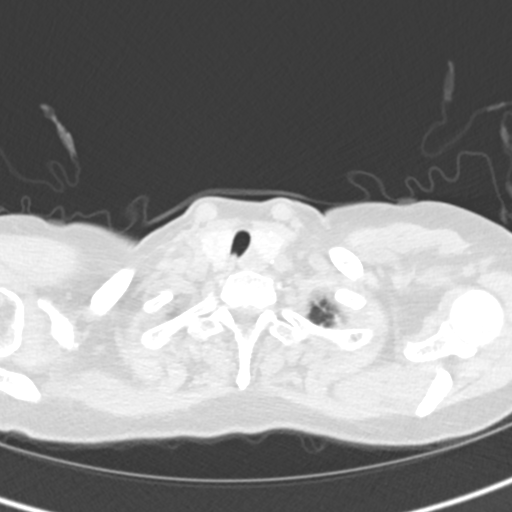

[Series 8: coronal · coronal · 0.50mm/px · 3 of 79 slices shown]
[im 16/79  lung]
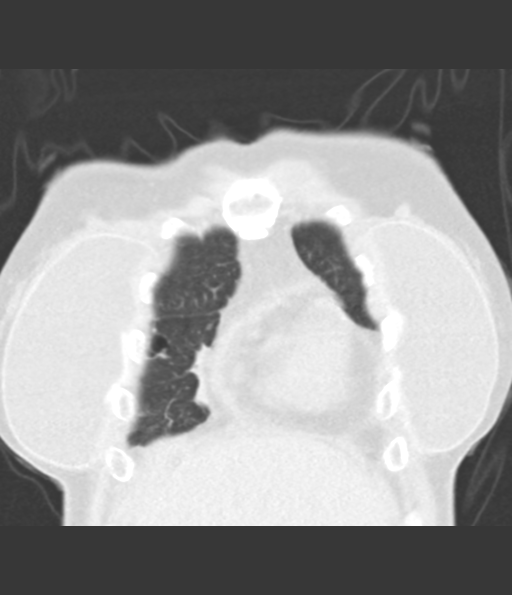
[im 32/79  lung]
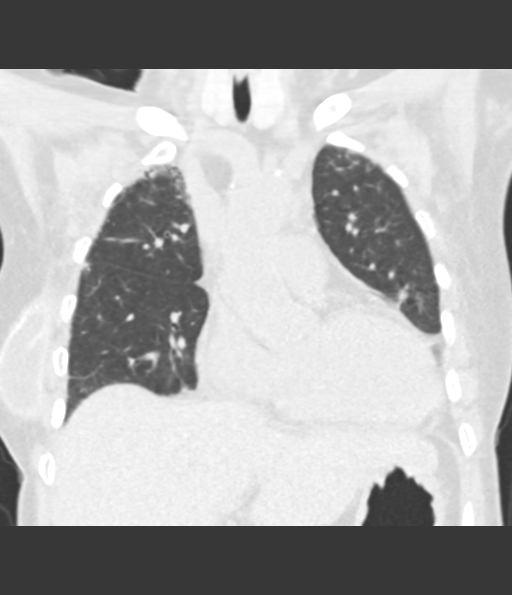
[im 47/79  lung]
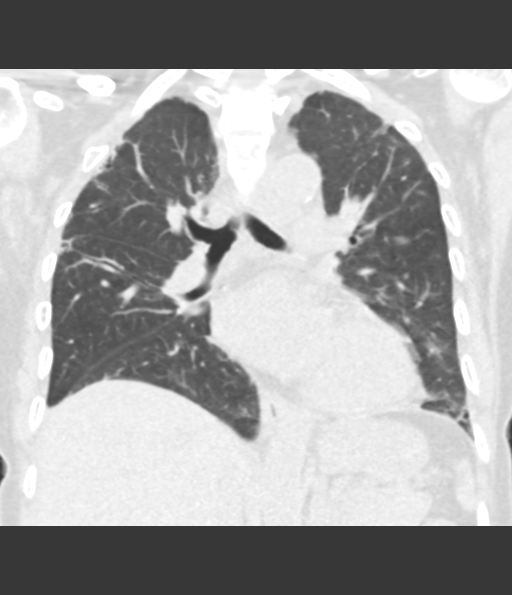

[15 of 36 positions shown; findings below may reference images not displayed]

FINDINGS: Mediastinum/Lymph Nodes: Low left internal jugular lymph node
measures 8 mm. Mediastinal lymph nodes measure up to 9 mm in the low
right paratracheal station. Calcified subcarinal lymph nodes. Hilar
regions are difficult to definitively evaluate without IV contrast.
There are hazy axillary lymph nodes bilaterally, none of which are
enlarged by CT size criteria. Coronary artery calcification. Heart
is mildly enlarged. No pericardial effusion. Pre pericardiac lymph
nodes are sub cm in short axis size.

Lungs/Pleura: There is somewhat basilar predominant mild subpleural
reticulation with traction bronchiolectasis. No definite
honeycombing. Image quality is somewhat degraded by respiratory
motion. No air trapping. No pleural fluid. Airway is unremarkable.

Upper abdomen: Visualized portions of the liver, left adrenal gland
and left kidney are unremarkable. Numerous calcifications in the
spleen. Visualized portions of the pancreas, stomach and bowel are
grossly unremarkable. No upper abdominal adenopathy.

Musculoskeletal: No worrisome lytic or sclerotic lesions.
IMPRESSION: 1. Pulmonary parenchymal pattern of mild fibrosis may be due to
nonspecific interstitial pneumonitis or usual interstitial
pneumonitis.
2. Numerous subcentimeter hazy mediastinal and axillary lymph nodes.
While mediastinal adenopathy can be seen in the setting of
interstitial lung disease, axillary adenopathy is unexpected. A
lymphoproliferative disorder cannot be excluded. Sarcoid is
considered less likely given absence of perilymphatic nodularity in
the lungs.
3. Coronary artery calcification.
